# Patient Record
Sex: Female | Born: 1950
Health system: Southern US, Community
[De-identification: ages and names within clinical notes are randomized; demographics above are authoritative.]

## PROBLEM LIST (undated history)

## (undated) DIAGNOSIS — F32A Depression, unspecified: Secondary | ICD-10-CM

## (undated) DIAGNOSIS — K5909 Other constipation: Secondary | ICD-10-CM

## (undated) DIAGNOSIS — R7303 Prediabetes: Secondary | ICD-10-CM

## (undated) DIAGNOSIS — M51369 Other intervertebral disc degeneration, lumbar region without mention of lumbar back pain or lower extremity pain: Secondary | ICD-10-CM

## (undated) DIAGNOSIS — Z974 Presence of external hearing-aid: Secondary | ICD-10-CM

## (undated) DIAGNOSIS — Z720 Tobacco use: Secondary | ICD-10-CM

## (undated) DIAGNOSIS — K219 Gastro-esophageal reflux disease without esophagitis: Secondary | ICD-10-CM

## (undated) DIAGNOSIS — I10 Essential (primary) hypertension: Secondary | ICD-10-CM

## (undated) DIAGNOSIS — F329 Major depressive disorder, single episode, unspecified: Secondary | ICD-10-CM

## (undated) DIAGNOSIS — M1990 Unspecified osteoarthritis, unspecified site: Secondary | ICD-10-CM

## (undated) DIAGNOSIS — N950 Postmenopausal bleeding: Secondary | ICD-10-CM

## (undated) DIAGNOSIS — E039 Hypothyroidism, unspecified: Secondary | ICD-10-CM

## (undated) DIAGNOSIS — H3530 Unspecified macular degeneration: Secondary | ICD-10-CM

## (undated) DIAGNOSIS — D509 Iron deficiency anemia, unspecified: Secondary | ICD-10-CM

## (undated) DIAGNOSIS — R7301 Impaired fasting glucose: Secondary | ICD-10-CM

## (undated) DIAGNOSIS — E079 Disorder of thyroid, unspecified: Secondary | ICD-10-CM

## (undated) DIAGNOSIS — M722 Plantar fascial fibromatosis: Secondary | ICD-10-CM

## (undated) DIAGNOSIS — G4733 Obstructive sleep apnea (adult) (pediatric): Secondary | ICD-10-CM

## (undated) DIAGNOSIS — E669 Obesity, unspecified: Secondary | ICD-10-CM

## (undated) DIAGNOSIS — M5030 Other cervical disc degeneration, unspecified cervical region: Secondary | ICD-10-CM

## (undated) DIAGNOSIS — M5136 Other intervertebral disc degeneration, lumbar region: Secondary | ICD-10-CM

## (undated) DIAGNOSIS — Z973 Presence of spectacles and contact lenses: Secondary | ICD-10-CM

## (undated) DIAGNOSIS — M48061 Spinal stenosis, lumbar region without neurogenic claudication: Secondary | ICD-10-CM

## (undated) HISTORY — DX: Plantar fascial fibromatosis: M72.2

## (undated) HISTORY — PX: OTHER SURGICAL HISTORY: SHX169

## (undated) HISTORY — DX: Tobacco use: Z72.0

## (undated) HISTORY — PX: TONSILLECTOMY: SUR1361

## (undated) HISTORY — DX: Other intervertebral disc degeneration, lumbar region without mention of lumbar back pain or lower extremity pain: M51.369

## (undated) HISTORY — DX: Disorder of thyroid, unspecified: E07.9

## (undated) HISTORY — DX: Obesity, unspecified: E66.9

## (undated) HISTORY — PX: TUBAL LIGATION: SHX77

## (undated) HISTORY — DX: Impaired fasting glucose: R73.01

## (undated) HISTORY — DX: Depression, unspecified: F32.A

## (undated) HISTORY — DX: Major depressive disorder, single episode, unspecified: F32.9

## (undated) HISTORY — DX: Other intervertebral disc degeneration, lumbar region: M51.36

---

## 1992-12-12 HISTORY — PX: HEMORRHOID SURGERY: SHX153

## 1999-12-13 HISTORY — PX: UVULOPALATOPHARYNGOPLASTY: SHX827

## 2005-03-16 ENCOUNTER — Encounter: Payer: Self-pay | Admitting: Family Medicine

## 2005-09-27 ENCOUNTER — Ambulatory Visit: Payer: Self-pay | Admitting: Family Medicine

## 2005-11-28 ENCOUNTER — Ambulatory Visit: Payer: Self-pay | Admitting: Family Medicine

## 2005-12-26 ENCOUNTER — Ambulatory Visit: Payer: Self-pay | Admitting: Family Medicine

## 2006-01-04 ENCOUNTER — Ambulatory Visit: Payer: Self-pay | Admitting: Family Medicine

## 2006-01-09 ENCOUNTER — Ambulatory Visit: Payer: Self-pay | Admitting: Family Medicine

## 2006-01-26 ENCOUNTER — Ambulatory Visit: Payer: Self-pay | Admitting: Family Medicine

## 2006-02-23 ENCOUNTER — Ambulatory Visit: Payer: Self-pay | Admitting: Family Medicine

## 2006-05-29 ENCOUNTER — Ambulatory Visit: Payer: Self-pay | Admitting: Family Medicine

## 2006-09-19 DIAGNOSIS — M542 Cervicalgia: Secondary | ICD-10-CM | POA: Insufficient documentation

## 2006-09-19 DIAGNOSIS — E785 Hyperlipidemia, unspecified: Secondary | ICD-10-CM

## 2006-09-19 DIAGNOSIS — I10 Essential (primary) hypertension: Secondary | ICD-10-CM

## 2006-09-19 DIAGNOSIS — G4700 Insomnia, unspecified: Secondary | ICD-10-CM

## 2006-09-19 DIAGNOSIS — F329 Major depressive disorder, single episode, unspecified: Secondary | ICD-10-CM

## 2006-09-19 DIAGNOSIS — N951 Menopausal and female climacteric states: Secondary | ICD-10-CM | POA: Insufficient documentation

## 2006-09-19 DIAGNOSIS — F17200 Nicotine dependence, unspecified, uncomplicated: Secondary | ICD-10-CM

## 2006-09-19 DIAGNOSIS — L2089 Other atopic dermatitis: Secondary | ICD-10-CM

## 2006-10-02 ENCOUNTER — Encounter: Payer: Self-pay | Admitting: Family Medicine

## 2006-11-30 ENCOUNTER — Encounter: Payer: Self-pay | Admitting: Family Medicine

## 2006-12-01 ENCOUNTER — Encounter: Payer: Self-pay | Admitting: Family Medicine

## 2006-12-20 ENCOUNTER — Encounter: Payer: Self-pay | Admitting: Family Medicine

## 2007-12-25 ENCOUNTER — Ambulatory Visit: Payer: Self-pay | Admitting: Family Medicine

## 2007-12-26 ENCOUNTER — Encounter: Payer: Self-pay | Admitting: Family Medicine

## 2007-12-26 LAB — CONVERTED CEMR LAB
AST: 19 units/L (ref 0–37)
Albumin: 4.6 g/dL (ref 3.5–5.2)
Creatinine, Ser: 1.05 mg/dL (ref 0.40–1.20)
Glucose, Bld: 110 mg/dL — ABNORMAL HIGH (ref 70–99)
HCT: 47.6 % — ABNORMAL HIGH (ref 36.0–46.0)
Hemoglobin: 15.5 g/dL — ABNORMAL HIGH (ref 12.0–15.0)
MCV: 86.2 fL (ref 78.0–100.0)
Platelets: 303 10*3/uL (ref 150–400)
Potassium: 4.5 meq/L (ref 3.5–5.3)
Total Protein: 7.1 g/dL (ref 6.0–8.3)
WBC: 7.2 10*3/uL (ref 4.0–10.5)

## 2007-12-27 ENCOUNTER — Encounter: Payer: Self-pay | Admitting: Family Medicine

## 2007-12-27 ENCOUNTER — Telehealth: Payer: Self-pay | Admitting: Family Medicine

## 2008-01-02 ENCOUNTER — Encounter: Payer: Self-pay | Admitting: Family Medicine

## 2008-01-23 ENCOUNTER — Telehealth: Payer: Self-pay | Admitting: Family Medicine

## 2008-01-25 ENCOUNTER — Ambulatory Visit: Payer: Self-pay | Admitting: Family Medicine

## 2008-01-30 ENCOUNTER — Encounter: Payer: Self-pay | Admitting: Family Medicine

## 2008-01-30 ENCOUNTER — Encounter: Admission: RE | Admit: 2008-01-30 | Discharge: 2008-02-08 | Payer: Self-pay | Admitting: Family Medicine

## 2008-02-08 ENCOUNTER — Encounter: Payer: Self-pay | Admitting: Family Medicine

## 2008-02-14 ENCOUNTER — Telehealth: Payer: Self-pay | Admitting: Family Medicine

## 2008-02-19 ENCOUNTER — Encounter: Payer: Self-pay | Admitting: Family Medicine

## 2008-02-19 ENCOUNTER — Telehealth: Payer: Self-pay | Admitting: Family Medicine

## 2008-03-07 ENCOUNTER — Telehealth: Payer: Self-pay | Admitting: Family Medicine

## 2008-06-17 ENCOUNTER — Telehealth: Payer: Self-pay | Admitting: Family Medicine

## 2008-07-29 ENCOUNTER — Other Ambulatory Visit: Admission: RE | Admit: 2008-07-29 | Discharge: 2008-07-29 | Payer: Self-pay | Admitting: Family Medicine

## 2008-07-29 ENCOUNTER — Encounter: Payer: Self-pay | Admitting: Family Medicine

## 2008-07-29 ENCOUNTER — Ambulatory Visit: Payer: Self-pay | Admitting: Family Medicine

## 2008-07-30 ENCOUNTER — Encounter: Payer: Self-pay | Admitting: Family Medicine

## 2008-07-31 LAB — CONVERTED CEMR LAB
ALT: 15 units/L (ref 0–35)
AST: 17 units/L (ref 0–37)
Alkaline Phosphatase: 77 units/L (ref 39–117)
BUN: 15 mg/dL (ref 6–23)
Basophils Absolute: 0 10*3/uL (ref 0.0–0.1)
Basophils Relative: 0 % (ref 0–1)
Eosinophils Absolute: 0.2 10*3/uL (ref 0.0–0.7)
Eosinophils Relative: 3 % (ref 0–5)
Folate: >20 ng/mL
HCT: 48.9 % — ABNORMAL HIGH (ref 36.0–46.0)
HDL: 42 mg/dL (ref >39)
Hemoglobin: 16.3 g/dL — ABNORMAL HIGH (ref 12.0–15.0)
LDL Cholesterol: 119 mg/dL — ABNORMAL HIGH (ref 0–99)
Lymphocytes Relative: 29 % (ref 12–46)
Lymphs Abs: 2.1 10*3/uL (ref 0.7–4.0)
Monocytes Absolute: 0.8 10*3/uL (ref 0.1–1.0)
Neutro Abs: 4.2 10*3/uL (ref 1.7–7.7)
Neutrophils Relative %: 58 % (ref 43–77)
Platelets: 277 10*3/uL (ref 150–400)
Potassium: 4.1 meq/L (ref 3.5–5.3)
Total Bilirubin: 0.9 mg/dL (ref 0.3–1.2)
Total Protein: 7.5 g/dL (ref 6.0–8.3)
Vit D, 1,25-Dihydroxy: 24 — ABNORMAL LOW (ref 30–89)

## 2008-08-05 ENCOUNTER — Encounter: Admission: RE | Admit: 2008-08-05 | Discharge: 2008-08-05 | Payer: Self-pay | Admitting: Family Medicine

## 2008-09-01 ENCOUNTER — Ambulatory Visit: Payer: Self-pay | Admitting: Family Medicine

## 2008-09-01 DIAGNOSIS — R7301 Impaired fasting glucose: Secondary | ICD-10-CM

## 2008-09-01 LAB — CONVERTED CEMR LAB: Blood Glucose, Fasting: 113 mg/dL

## 2008-09-02 ENCOUNTER — Telehealth: Payer: Self-pay | Admitting: Family Medicine

## 2008-09-09 ENCOUNTER — Telehealth: Payer: Self-pay | Admitting: Family Medicine

## 2008-09-15 ENCOUNTER — Ambulatory Visit: Payer: Self-pay | Admitting: Family Medicine

## 2008-09-15 ENCOUNTER — Encounter: Admission: RE | Admit: 2008-09-15 | Discharge: 2008-09-15 | Payer: Self-pay | Admitting: Family Medicine

## 2008-09-15 DIAGNOSIS — M5137 Other intervertebral disc degeneration, lumbosacral region: Secondary | ICD-10-CM

## 2008-09-18 ENCOUNTER — Encounter: Payer: Self-pay | Admitting: Family Medicine

## 2008-09-26 ENCOUNTER — Ambulatory Visit: Payer: Self-pay | Admitting: Family Medicine

## 2008-09-26 DIAGNOSIS — M79609 Pain in unspecified limb: Secondary | ICD-10-CM | POA: Insufficient documentation

## 2008-09-30 ENCOUNTER — Encounter: Payer: Self-pay | Admitting: Family Medicine

## 2008-10-06 ENCOUNTER — Telehealth: Payer: Self-pay | Admitting: Family Medicine

## 2008-10-09 ENCOUNTER — Telehealth: Payer: Self-pay | Admitting: Family Medicine

## 2008-10-27 ENCOUNTER — Ambulatory Visit: Payer: Self-pay | Admitting: Family Medicine

## 2008-10-27 ENCOUNTER — Encounter: Admission: RE | Admit: 2008-10-27 | Discharge: 2008-10-27 | Payer: Self-pay | Admitting: Family Medicine

## 2008-10-27 DIAGNOSIS — R1319 Other dysphagia: Secondary | ICD-10-CM

## 2008-10-27 DIAGNOSIS — Q762 Congenital spondylolisthesis: Secondary | ICD-10-CM | POA: Insufficient documentation

## 2008-10-31 ENCOUNTER — Encounter: Admission: RE | Admit: 2008-10-31 | Discharge: 2008-12-11 | Payer: Self-pay | Admitting: Sports Medicine

## 2008-11-10 ENCOUNTER — Encounter: Payer: Self-pay | Admitting: Family Medicine

## 2008-11-19 ENCOUNTER — Telehealth (INDEPENDENT_AMBULATORY_CARE_PROVIDER_SITE_OTHER): Payer: Self-pay | Admitting: *Deleted

## 2008-11-20 ENCOUNTER — Ambulatory Visit: Payer: Self-pay | Admitting: Family Medicine

## 2008-11-20 LAB — CONVERTED CEMR LAB: Blood Glucose, Fasting: 119 mg/dL

## 2008-11-26 ENCOUNTER — Encounter: Payer: Self-pay | Admitting: Family Medicine

## 2008-12-08 IMAGING — CR DG LUMBAR SPINE 2-3V
3 series · 3 of 3 positions shown · non-contrast
Comparison: None

CLINICAL DATA: Chronic low back pain.

LUMBAR SPINE - 2-3 VIEW

[view not recorded (1 of 3)]
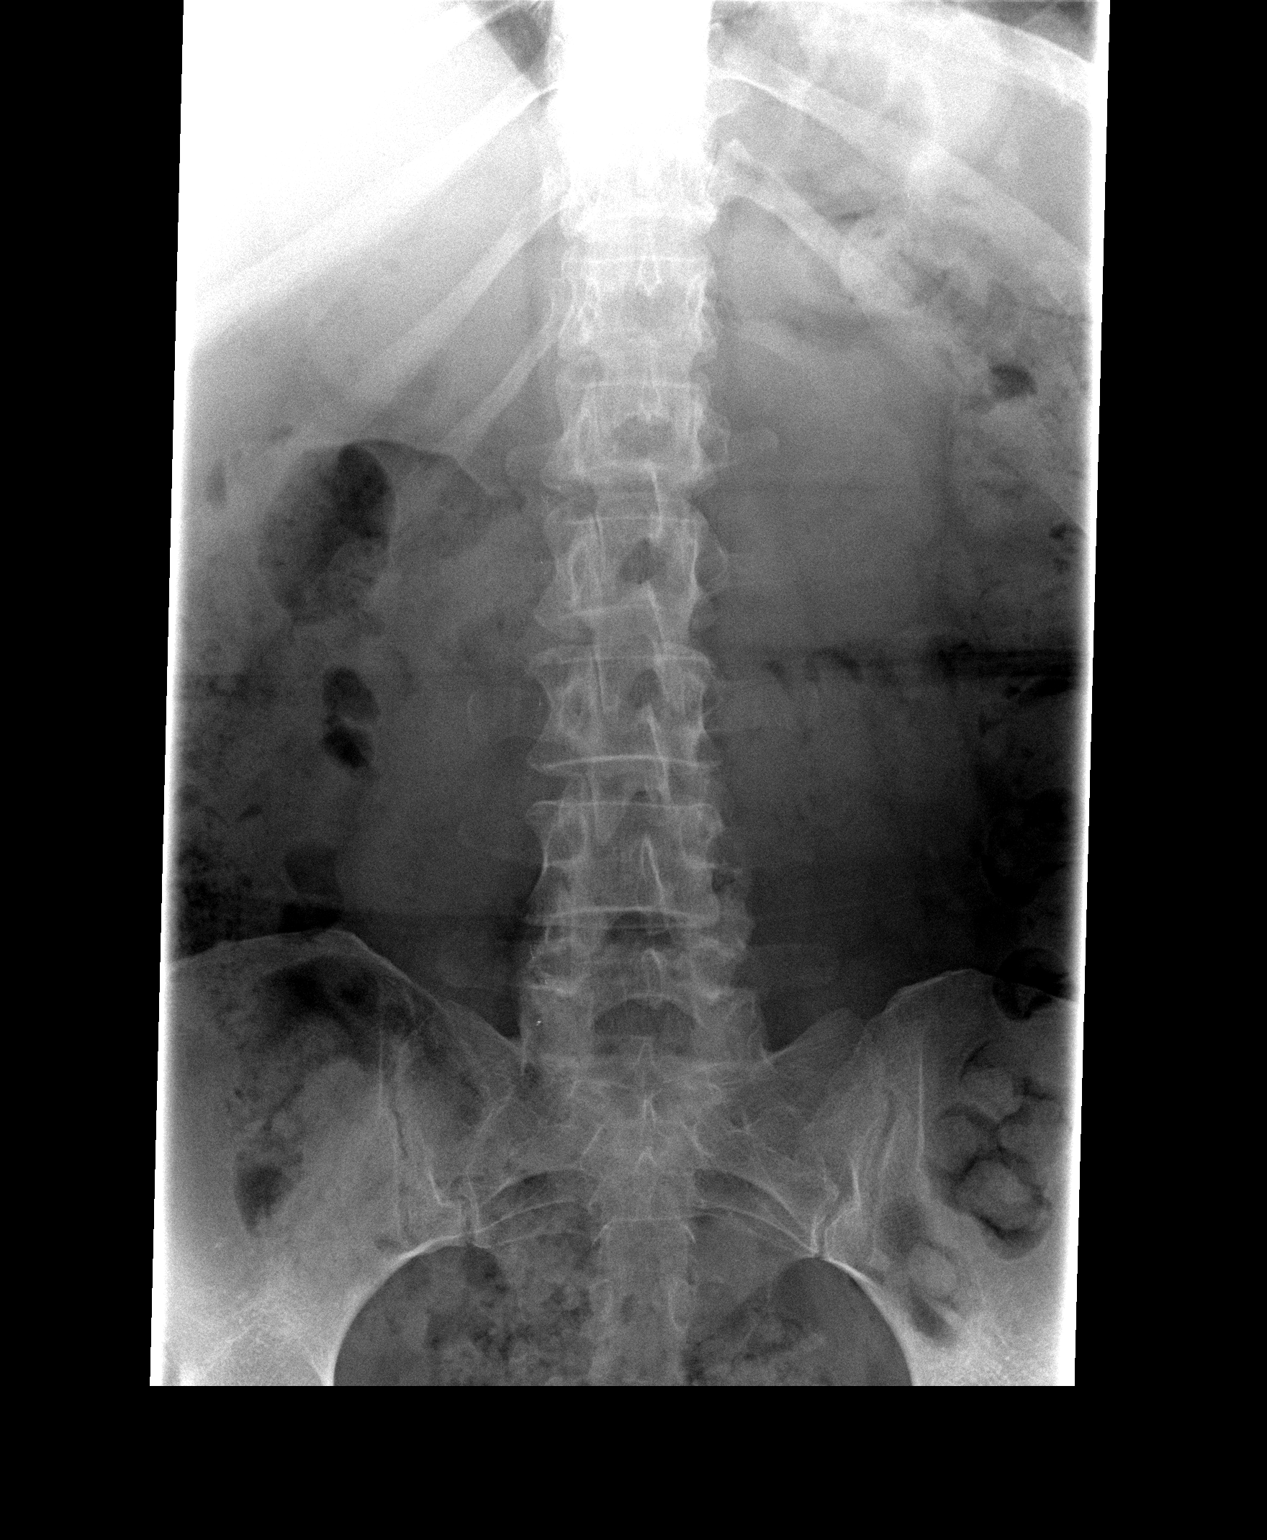

[view not recorded (2 of 3)]
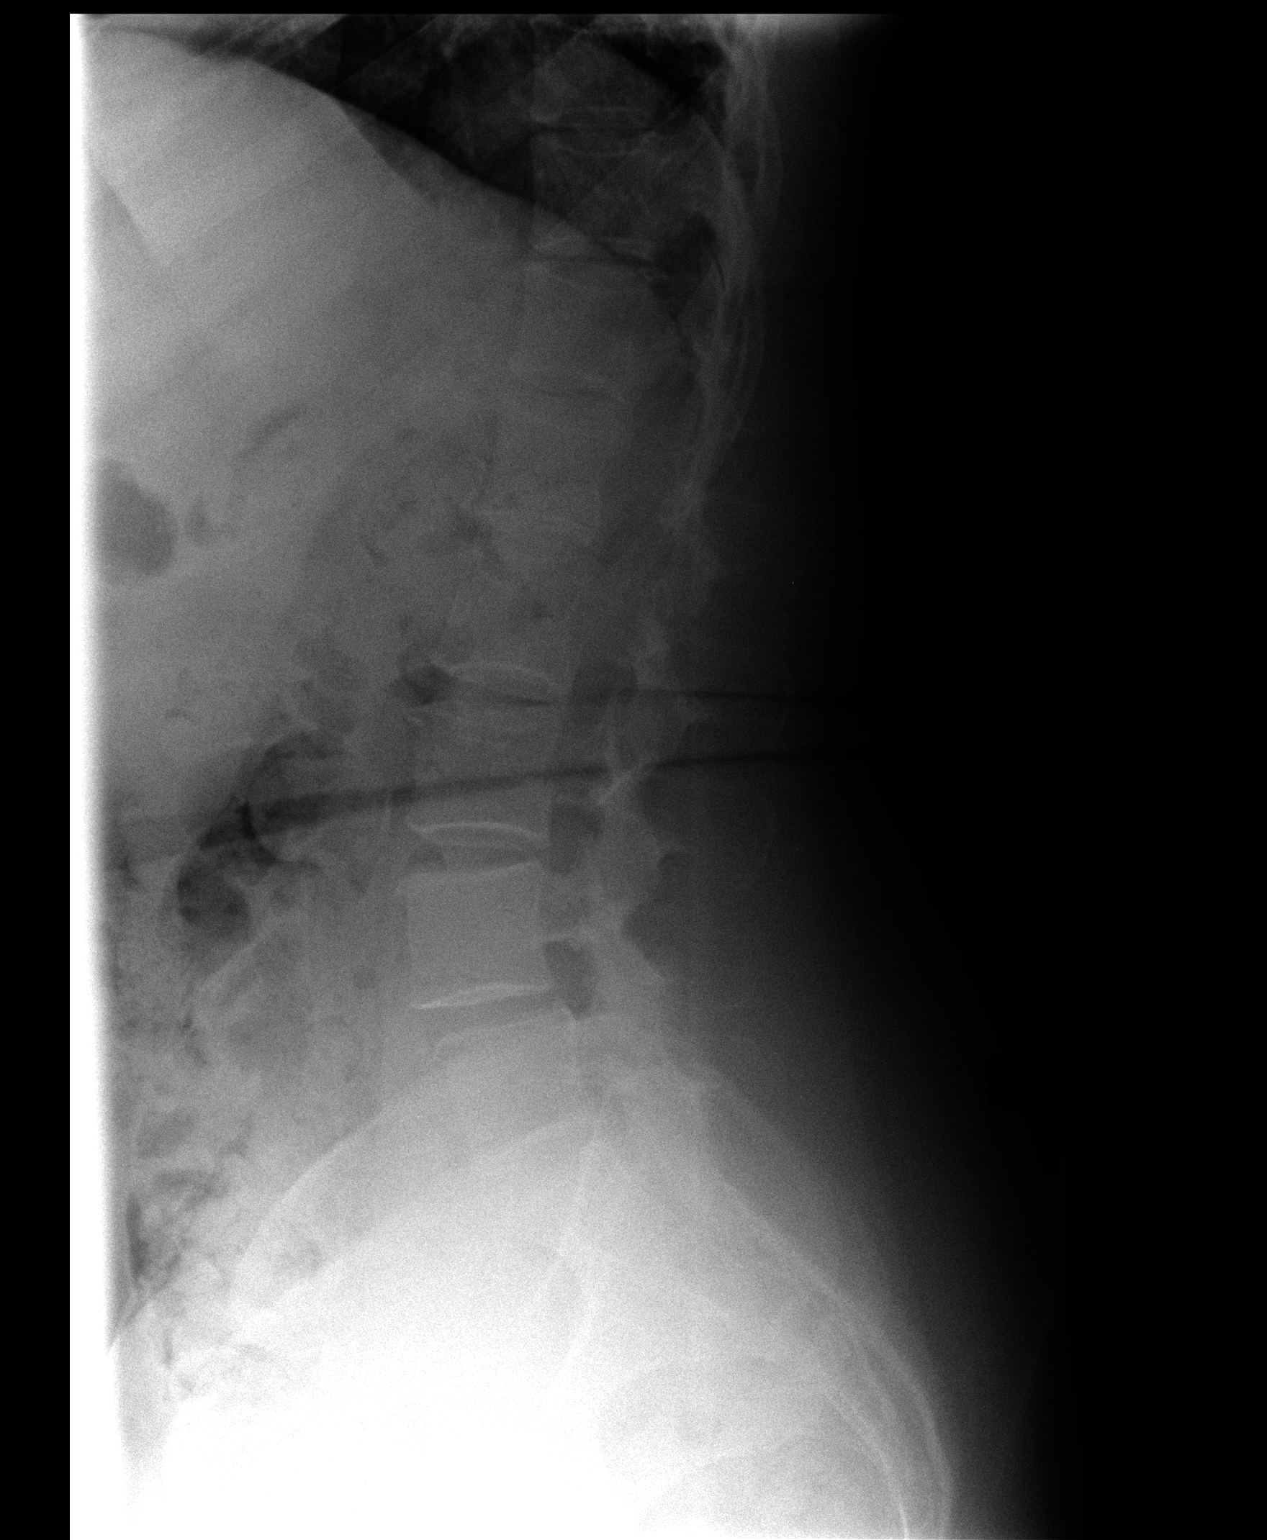

[view not recorded (3 of 3)]
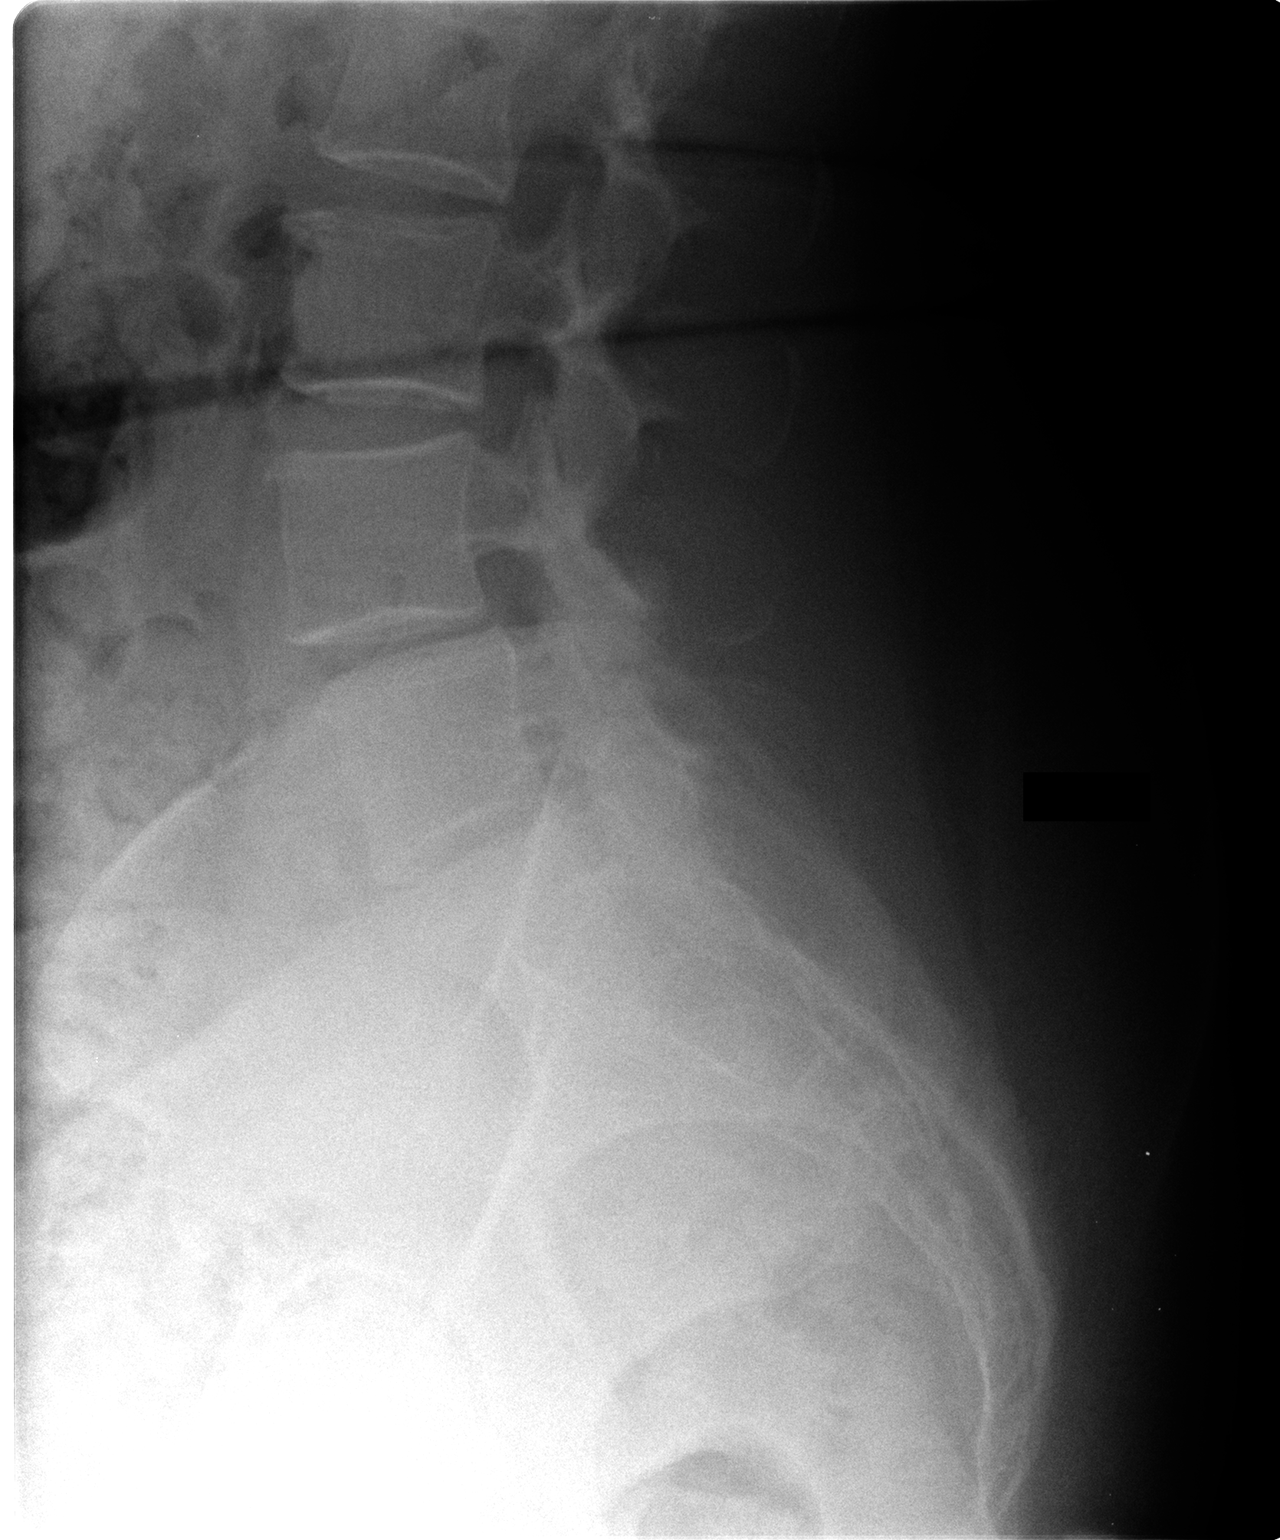

[3 of 3 positions shown; findings below may reference images not displayed]

FINDINGS: There is mild grade 1 slip of L4 on L5 which appears to
be due to facet degeneration.  There is mild disc space narrowing
at L4-5.  The remaining disc spaces are intact.  There is no
fracture.
IMPRESSION: Grade 1 slip of L4-L5.  No acute bony abnormality.

## 2008-12-09 ENCOUNTER — Telehealth: Payer: Self-pay | Admitting: Family Medicine

## 2009-01-02 ENCOUNTER — Telehealth: Payer: Self-pay | Admitting: Family Medicine

## 2009-04-03 ENCOUNTER — Telehealth: Payer: Self-pay | Admitting: Family Medicine

## 2009-06-05 ENCOUNTER — Telehealth: Payer: Self-pay | Admitting: Family Medicine

## 2009-06-16 ENCOUNTER — Telehealth: Payer: Self-pay | Admitting: Family Medicine

## 2009-06-19 ENCOUNTER — Ambulatory Visit: Payer: Self-pay | Admitting: Family Medicine

## 2009-07-17 ENCOUNTER — Telehealth: Payer: Self-pay | Admitting: Family Medicine

## 2009-08-19 ENCOUNTER — Telehealth: Payer: Self-pay | Admitting: Family Medicine

## 2009-09-15 ENCOUNTER — Telehealth: Payer: Self-pay | Admitting: Family Medicine

## 2009-12-02 ENCOUNTER — Telehealth: Payer: Self-pay | Admitting: Family Medicine

## 2009-12-16 ENCOUNTER — Telehealth: Payer: Self-pay | Admitting: Family Medicine

## 2010-01-07 ENCOUNTER — Telehealth: Payer: Self-pay | Admitting: Family Medicine

## 2010-02-17 ENCOUNTER — Telehealth (INDEPENDENT_AMBULATORY_CARE_PROVIDER_SITE_OTHER): Payer: Self-pay | Admitting: *Deleted

## 2010-03-22 ENCOUNTER — Telehealth: Payer: Self-pay | Admitting: Family Medicine

## 2010-06-25 ENCOUNTER — Ambulatory Visit: Payer: Self-pay | Admitting: Family Medicine

## 2010-06-25 DIAGNOSIS — R5381 Other malaise: Secondary | ICD-10-CM | POA: Insufficient documentation

## 2010-06-25 DIAGNOSIS — R1310 Dysphagia, unspecified: Secondary | ICD-10-CM | POA: Insufficient documentation

## 2010-06-25 DIAGNOSIS — R5383 Other fatigue: Secondary | ICD-10-CM

## 2010-06-26 ENCOUNTER — Encounter: Payer: Self-pay | Admitting: Family Medicine

## 2010-06-28 LAB — CONVERTED CEMR LAB
Anti Nuclear Antibody(ANA): POSITIVE — AB
BUN: 16 mg/dL (ref 6–23)
CRP: 0.7 mg/dL — ABNORMAL HIGH (ref <0.6)
Chloride: 100 meq/L (ref 96–112)
Glucose, Bld: 96 mg/dL (ref 70–99)
HCT: 45.4 % (ref 36.0–46.0)
MCV: 87.6 fL (ref 78.0–100.0)
Platelets: 298 10*3/uL (ref 150–400)
Potassium: 3.9 meq/L (ref 3.5–5.3)

## 2010-07-09 ENCOUNTER — Telehealth: Payer: Self-pay | Admitting: Family Medicine

## 2010-07-14 ENCOUNTER — Telehealth: Payer: Self-pay | Admitting: Family Medicine

## 2010-08-10 ENCOUNTER — Ambulatory Visit: Payer: Self-pay | Admitting: Family Medicine

## 2010-08-10 ENCOUNTER — Encounter: Admission: RE | Admit: 2010-08-10 | Discharge: 2010-08-10 | Payer: Self-pay | Admitting: Family Medicine

## 2010-08-13 ENCOUNTER — Telehealth: Payer: Self-pay | Admitting: Family Medicine

## 2010-08-31 ENCOUNTER — Ambulatory Visit: Payer: Self-pay | Admitting: Obstetrics & Gynecology

## 2010-09-02 ENCOUNTER — Encounter: Admission: RE | Admit: 2010-09-02 | Discharge: 2010-09-02 | Payer: Self-pay | Admitting: Obstetrics & Gynecology

## 2010-09-17 ENCOUNTER — Ambulatory Visit: Payer: Self-pay | Admitting: Family Medicine

## 2010-09-23 ENCOUNTER — Telehealth: Payer: Self-pay | Admitting: Family Medicine

## 2010-10-04 ENCOUNTER — Ambulatory Visit (HOSPITAL_COMMUNITY): Payer: Self-pay | Admitting: Psychology

## 2010-10-05 ENCOUNTER — Telehealth (INDEPENDENT_AMBULATORY_CARE_PROVIDER_SITE_OTHER): Payer: Self-pay | Admitting: *Deleted

## 2010-10-11 ENCOUNTER — Ambulatory Visit (HOSPITAL_COMMUNITY): Payer: Self-pay | Admitting: Psychology

## 2011-01-11 NOTE — Assessment & Plan Note (Signed)
Summary: dysphagia/ depression   Vital Signs:  Patient profile:   60 year old female Height:      63 inches Weight:      167 pounds BMI:     29.69 O2 Sat:      99 % on Room air Temp:     98.9 degrees F oral Pulse rate:   91 / minute BP sitting:   138 / 81  (left arm) Cuff size:   regular  Vitals Entered By: Payton Spark CMA (June 25, 2010 2:32 PM)  O2 Flow:  Room air CC: ST x 6 weeks. Describes as raw and tight.   Primary Care Provider:  Seymour Bars DO  CC:  ST x 6 weeks. Describes as raw and tight..  History of Present Illness: 60 yo WF presents for sore throat x 6 wks.  She feels tightness in her throat.  It gets worse at the end of the day.  She also feels like the skin on her arms and legs are too tight.  She is also having pain in her toes.  She quit smoking 3 mos ago.  She has vague aches and pains all over which seem to come and go and then admits that she has been under a great deal of stress and is quite tearful.  Her son got out of jail last year and she has not spoken to him.  Her husband is not a great listener and she has isolated herself from other family or any friends.      Current Medications (verified): 1)  Benazepril-Hydrochlorothiazide 20-12.5 Mg  Tabs (Benazepril-Hydrochlorothiazide) .Marland Kitchen.. 1 Tab By Mouth Two Times A Day 2)  Simvastatin 20 Mg  Tabs (Simvastatin) .Marland Kitchen.. 1 Tab By Mouth Qhs 3)  Omeprazole 20 Mg Cpdr (Omeprazole) .... By Mouth Once Daily 4)  Zolpidem Tartrate 10 Mg Tabs (Zolpidem Tartrate) .Marland Kitchen.. 1 Tab By Mouth At Bedtime As Needed Sleep 5)  Cyclobenzaprine Hcl 5 Mg  Tabs (Cyclobenzaprine Hcl) .Marland Kitchen.. 1-2 Tabs By Mouth At Bedtime As Needed Back Pain 6)  Vitamin D [address] Unit  Caps (Ergocalciferol) .... One By Mouth Once A Week.  Allergies (verified): 1)  ! Codeine 2)  ! Penicillin  Past History:  Past Medical History: plantar fasciits (seeing Dr Ihor Gully) Mild Lumbar DDD tobacco abuse impaired fasting glucose depression  Past Surgical  History: Reviewed history from 07/29/2008 and no changes required. LESI under fluroscopy w/ Dr Yves Dill tonsillectomy, adenoids, and uvula removed for sleep apnea c/sec  tubal ligation  Social History: Reviewed history from 07/29/2008 and no changes required. 2nd marriage to husband Molly Maduro.  Retired from Engineer, production for [employer]. 25 yo son in Prison in [place]-- gets out in 2007.    Walks daily. Current Smoker.  Husband is also a smoker.   Review of Systems Psych:  Complains of anxiety, depression, easily angered, easily tearful, and irritability; denies alternate hallucination ( auditory/visual), panic attacks, sense of great danger, suicidal thoughts/plans, and thoughts of violence.  Physical Exam  General:  alert, well-hydrated, and overweight-appearing.   Head:  normocephalic and atraumatic.   Eyes:  conjunctiva clar Nose:  no nasal discharge.   Mouth:  pharynx pink and moist and fair dentition.   Neck:  supple and no masses.  able to swallow on command Lungs:  Normal respiratory effort, chest expands symmetrically. Lungs are clear to auscultation, no crackles or wheezes. Heart:  Normal rate and regular rhythm. S1 and S2 normal without gallop, murmur, click, rub  or other extra sounds. Abdomen:  soft and non-tender.   Msk:  no joint tenderness, no joint swelling, and no joint warmth.   Pulses:  2+ radial pulses Extremities:  no LE dema Skin:  color normal and no suspicious lesions.   Cervical Nodes:  No lymphadenopathy noted Psych:  depressed affect.     Impression & Recommendations:  Problem # 1:  DEPRESSION, MAJOR, RECURRENT (ICD-296.30) Rebecca Austin seems very depressed, worsened by mutliple acute life stressors.  Failed to improve on SSRIs in the past.  Declined counseling referrral.  Verbally contracts that she will not harm herself or others.  Agrees to try Wellbutrin.  Call if any worsening in mood with new start of meds.  RTC in 1 month for f/u.     Problem # 2:  FATIGUE (ICD-780.79) Likely to be secondary to depression but will update her labs and include a CBC. Orders: T-CBC No Diff ([id number])  Problem # 3:  DYSPHAGIA UNSPECIFIED (ICD-787.20) Assessment: New 'Tightness' in throat w/o heartburn or food getting stuck in the esphagus, also with 'tightness' of skin concerning for scleroderma.  Normal exam findings.  Will include labs for screening for scleroderma today.  If all is normal, this may be anxiety induced.  Problem # 4:  HYPERTENSION, BENIGN SYSTEMIC (ICD-401.1)  Her updated medication list for this problem includes:    Benazepril-hydrochlorothiazide 20-12.5 Mg Tabs (Benazepril-hydrochlorothiazide) .Marland Kitchen... 1 tab by mouth two times a day  Orders: T-Basic Metabolic Panel ([id number])  BP today: 138/81 Prior BP: 111/68 (06/19/2009)  Labs Reviewed: K+: 4.1 (07/30/2008) Creat: : 0.96 (07/30/2008)   Chol: 198 (07/30/2008)   HDL: 42 (07/30/2008)   LDL: 119 (07/30/2008)   TG: 186 (07/30/2008)  Complete Medication List: 1)  Benazepril-hydrochlorothiazide 20-12.5 Mg Tabs (Benazepril-hydrochlorothiazide) .Marland Kitchen.. 1 tab by mouth two times a day 2)  Simvastatin 20 Mg Tabs (Simvastatin) .Marland Kitchen.. 1 tab by mouth qhs 3)  Omeprazole 20 Mg Cpdr (Omeprazole) .... By mouth once daily 4)  Zolpidem Tartrate 10 Mg Tabs (Zolpidem tartrate) .Marland Kitchen.. 1 tab by mouth at bedtime as needed sleep 5)  Cyclobenzaprine Hcl 5 Mg Tabs (Cyclobenzaprine hcl) .Marland Kitchen.. 1-2 tabs by mouth at bedtime as needed back pain 6)  Budeprion Xl 150 Mg Xr24h-tab (Bupropion hcl) .Marland Kitchen.. 1 tab by mouth qam 7)  Alprazolam 0.5 Mg Tabs (Alprazolam) .Marland Kitchen.. 1 tab by mouth two times a day as needed anxiety  Other Orders: T-ANA ([id number]) T-TSH [phone]) T-CRP (C-Reactive Protein) ([address]) T-Sed Rate (Automated) ([id number])  Patient Instructions: 1)  For mood, take generic wellbutrin every morning. 2)  Use Alprazolam as needed for anxiety. 3)  Labs today. 4)  Will call you w/  results on Monday. 5)  Return for f/u mood in 6 wks. Prescriptions: ALPRAZOLAM 0.5 MG TABS (ALPRAZOLAM) 1 tab by mouth two times a day as needed anxiety  #45 x 0   Entered and Authorized by:   Seymour Bars DO   Signed by:   Seymour Bars DO on 06/25/2010   Method used:   Printed then faxed to ...       [facility] [address] (retail)       [address]       [place], [place]  [address]       Ph: [phone]       Fax: [phone]   RxID:   [id number] BUDEPRION XL 150 MG XR24H-TAB (BUPROPION HCL) 1 tab by mouth qAM  #30 x 1   Entered and Authorized by:   Seymour Bars  DO   Signed by:   Seymour Bars DO on 06/25/2010   Method used:   Electronically to        [facility] ([facility]ail)       [address]       [place], [place]  [address]       Ph: [phone]       Fax: [phone]   RxID:   [id number]

## 2011-01-11 NOTE — Progress Notes (Signed)
Summary: Chantix continue pack       New/Updated Medications: CHANTIX CONTINUING MONTH PAK 1 MG TABS (VARENICLINE TARTRATE) Take as directed Prescriptions: CHANTIX CONTINUING MONTH PAK 1 MG TABS (VARENICLINE TARTRATE) Take as directed  #1 box x 1   Entered by:   Payton Spark CMA   Authorized by:   Seymour Bars DO   Signed by:   Payton Spark CMA on 02/17/2010   Method used:   Electronically to        [facility] ([facility]ail[facility]       [address]       [place], [place]  [address]       Ph: [phone]       Fax: [phone]   RxID:   [id number]

## 2011-01-11 NOTE — Letter (Signed)
Summary: Depression Questionnaire  Depression Questionnaire   Imported By: Lanelle Bal 09/29/2010 12:20:40  _____________________________________________________________________  External Attachment:    Type:   Image     Comment:   External Document

## 2011-01-11 NOTE — Progress Notes (Signed)
Summary: Xanax refill  Phone Note Call from Patient   Caller: Patient Summary of Call: Pt LMOM stating she has been taking alprazolam 2 tabs two times a day bc her anxiety has been so bad. Pt states she has taken her last 2 pills today and needs refilled. Please advise. Initial call taken by: Payton Spark CMA,  July 14, 2010 11:15 AM  Follow-up for Phone Call        changed dose and sent new RX.  Will not increase from here. Follow-up by: Seymour Bars DO,  July 14, 2010 11:35 AM    New/Updated Medications: ALPRAZOLAM 1 MG TABS (ALPRAZOLAM) 1 tab by mouth two times a day as needed anxiety Prescriptions: ALPRAZOLAM 1 MG TABS (ALPRAZOLAM) 1 tab by mouth two times a day as needed anxiety  #60 x 0   Entered and Authorized by:   Seymour Bars DO   Signed by:   Seymour Bars DO on 07/14/2010   Method used:   Printed then faxed to ...       [facility] [address] (retail)       [address]       [place], [place]  [address]       Ph: [phone]       Fax: [phone]   RxID:   [id number]   Appended Document: Xanax refill Pt aware

## 2011-01-11 NOTE — Progress Notes (Signed)
Summary: Alprazolam refill  Phone Note Refill Request   Refills Requested: Medication #1:  ALPRAZOLAM 1 MG TABS 1 tab by mouth two times a day as needed anxiety Initial call taken by: Payton Spark CMA,  August 13, 2010 12:31 PM    Prescriptions: ALPRAZOLAM 1 MG TABS (ALPRAZOLAM) 1 tab by mouth two times a day as needed anxiety  #60 x 0   Entered and Authorized by:   Seymour Bars DO   Signed by:   Seymour Bars DO on 08/13/2010   Method used:   Printed then faxed to ...       [facility] [address] (retail)       [address]       [place], [place]  [address]       Ph: [phone]       Fax: [phone]   RxID:   [id number]

## 2011-01-11 NOTE — Progress Notes (Signed)
Summary: Zolpidem refills  Phone Note Refill Request   Refills Requested: Medication #1:  ZOLPIDEM TARTRATE 10 MG TABS 1 tab by mouth at bedtime as needed sleep Initial call taken by: Payton Spark CMA,  March 22, 2010 8:33 AM    Prescriptions: ZOLPIDEM TARTRATE 10 MG TABS (ZOLPIDEM TARTRATE) 1 tab by mouth at bedtime as needed sleep  #90 x 0   Entered and Authorized by:   Seymour Bars DO   Signed by:   Seymour Bars DO on 03/22/2010   Method used:   Printed then faxed to ...       [facility] [address] (retail)       [address]       [place], [place]  [address]       Ph: [phone]       Fax: [phone]   RxID:   [id number]

## 2011-01-11 NOTE — Progress Notes (Signed)
Summary: Smoking cessation  Phone Note Call from Patient   Caller: Patient Summary of Call: Pt and spouse would like to start smoking cessation. They would like Chantix called to pharm. Please advise . Initial call taken by: Payton Spark CMA,  January 07, 2010 3:44 PM    New/Updated Medications: CHANTIX STARTING MONTH PAK 0.5 MG X 11 & 1 MG X 42 TABS (VARENICLINE TARTRATE) use as directed Prescriptions: CHANTIX STARTING MONTH PAK 0.5 MG X 11 & 1 MG X 42 TABS (VARENICLINE TARTRATE) use as directed  #1 pack x 0   Entered and Authorized by:   Seymour Bars DO   Signed by:   Seymour Bars DO on 01/07/2010   Method used:   Electronically to        [facility] ([facility]ail)       [address]       [place], [place]  [address]       Ph: [phone]       Fax: [phone]   RxID:   [id number]

## 2011-01-11 NOTE — Assessment & Plan Note (Signed)
Summary: depression   Vital Signs:  Patient profile:   60 year old female Height:      63 inches Weight:      172 pounds BMI:     30.58 O2 Sat:      97 % on Room air Pulse rate:   99 / minute BP sitting:   124 / 76  (left arm) Cuff size:   regular  Vitals Entered By: Payton Spark CMA (August 10, 2010 2:01 PM)  O2 Flow:  Room air CC: F/U mood.   Primary Care Provider:  Seymour Bars DO  CC:  F/U mood.Marland Kitchen  History of Present Illness: 60 yo WF presents for f/u depression.  She has a lot of stress related to her son who is a drug/ ETOH abuser with 4 kids who all have problems.  She was started on generic Wellbutrin last month and has not noticed much of a difference.  Declined cousneling last visit.  Has a good support system.  REalizes that she has a long fam hx of depression.    She is drinking 1 glass of wine / day.    Current Medications (verified): 1)  Benazepril-Hydrochlorothiazide 20-12.5 Mg  Tabs (Benazepril-Hydrochlorothiazide) .Marland Kitchen.. 1 Tab By Mouth Two Times A Day 2)  Simvastatin 20 Mg  Tabs (Simvastatin) .Marland Kitchen.. 1 Tab By Mouth Qhs 3)  Omeprazole 20 Mg Cpdr (Omeprazole) .... By Mouth Once Daily 4)  Zolpidem Tartrate 10 Mg Tabs (Zolpidem Tartrate) .Marland Kitchen.. 1 Tab By Mouth At Bedtime As Needed Sleep 5)  Cyclobenzaprine Hcl 5 Mg  Tabs (Cyclobenzaprine Hcl) .Marland Kitchen.. 1-2 Tabs By Mouth At Bedtime As Needed Back Pain 6)  Budeprion Xl 150 Mg Xr24h-Tab (Bupropion Hcl) .Marland Kitchen.. 1 Tab By Mouth Qam 7)  Alprazolam 1 Mg Tabs (Alprazolam) .Marland Kitchen.. 1 Tab By Mouth Two Times A Day As Needed Anxiety 8)  Tramadol Hcl 50 Mg Tabs (Tramadol Hcl) .Marland Kitchen.. 1 Tab By Mouth Two Times A Day As Needed Back Pain  Allergies (verified): 1)  ! Codeine 2)  ! Penicillin  Past History:  Past Medical History: Reviewed history from 06/25/2010 and no changes required. plantar fasciits (seeing Dr Ihor Gully) Mild Lumbar DDD tobacco abuse impaired fasting glucose depression  Past Surgical History: Reviewed history from  07/29/2008 and no changes required. LESI under fluroscopy w/ Dr Yves Dill tonsillectomy, adenoids, and uvula removed for sleep apnea c/sec  tubal ligation  Family History: Reviewed history from 09/15/2008 and no changes required. 1 brother- healthy,  3 sisters- 1 w/ osteoporosis,  father HTN, depression, died AAA mother died of postmenopausal breast ca, age 25 MGF - diabetes  Social History: Reviewed history from 07/29/2008 and no changes required. 2nd marriage to husband Molly Maduro.  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    Walks daily. Current Smoker.  Husband is also a smoker.   Review of Systems Psych:  Complains of anxiety, depression, easily angered, easily tearful, irritability, and suicidal thoughts/plans; denies panic attacks, thoughts of violence, unusual visions or sounds, and thoughts /plans of harming others; no action plan to hurt herself. 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  Physical Exam  General:  alert, well-developed, well-nourished, and well-hydrated.   Psych:  depressed affect, tearful, and slightly anxious.     Impression & Recommendations:  Problem # 1:  DEPRESSION, MAJOR, RECURRENT (ICD-296.30) PHQ 9 still 58 today after 1 month of Wellubtrin 150 mg/ day.  Will increase her to 300  mg/ day. Call me in 2 wks to let me know how mood is.  May add Cymbalta on board in 2 wks. Use Alprazolam as needed for anxiety.  Limit ETOH. Verbally contracts that she will not harm herself.  RTC in 6 wks.  Complete Medication List: 1)  Benazepril-hydrochlorothiazide 20-12.5 Mg Tabs (Benazepril-hydrochlorothiazide) .Marland Kitchen.. 1 tab by mouth two times a day 2)  Simvastatin 20 Mg Tabs (Simvastatin) .Marland Kitchen.. 1 tab by mouth qhs 3)  Omeprazole 20 Mg Cpdr (Omeprazole) .... By mouth once daily 4)  Zolpidem Tartrate 10 Mg Tabs (Zolpidem tartrate) .Marland Kitchen.. 1 tab by mouth at bedtime as needed sleep 5)   Cyclobenzaprine Hcl 5 Mg Tabs (Cyclobenzaprine hcl) .Marland Kitchen.. 1-2 tabs by mouth at bedtime as needed back pain 6)  Budeprion Xl 300 Mg Xr24h-tab (Bupropion hcl) .Marland Kitchen.. 1 tab by mouth daily 7)  Alprazolam 1 Mg Tabs (Alprazolam) .Marland Kitchen.. 1 tab by mouth two times a day as needed anxiety 8)  Tramadol Hcl 50 Mg Tabs (Tramadol hcl) .Marland Kitchen.. 1 tab by mouth two times a day as needed back pain  Other Orders: Gynecologic Referral (Gyn)  Patient Instructions: 1)  Go up on Wellbutrin to 300 mg once daily for mood. 2)  Use Alprazolam as needed.  Will RF on FRI. 3)  Call me in 2 wks and let me know how your mood is doing. 4)  May add Cymbalta on board. 5)  Call to set up counseling downstairs when you are ready. 6)  Return for f/u in 6 wks. Prescriptions: BUDEPRION XL 300 MG XR24H-TAB (BUPROPION HCL) 1 tab by mouth daily  #30 x 2   Entered and Authorized by:   Seymour Bars DO   Signed by:   Seymour Bars DO on 08/10/2010   Method used:   Electronically to        [facility] ([facility]ail)       [address]       [place], [place]  [address]       Ph: [phone]       Fax: [phone]   RxID:   [id number]

## 2011-01-11 NOTE — Letter (Signed)
Summary: Depression Questionnaire  Depression Questionnaire   Imported By: Lanelle Bal 08/18/2010 08:27:05  _____________________________________________________________________  External Attachment:    Type:   Image     Comment:   External Document

## 2011-01-11 NOTE — Progress Notes (Signed)
Summary: Increase seroquel  Phone Note Call from Patient   Caller: Patient Summary of Call: Pt LMOM stating that after speaking w/ sleep clinic she has decided that her problem is that she can not fall asleep. She was told that the CPAP is not going to help w/ this. She states that seroquel is not helping like she had hoped and wanted to know if she could increase the dose. Please advise.  Initial call taken by: Payton Spark CMA,  October 05, 2010 3:47 PM  Follow-up for Phone Call        Yes we can try an inc dose. Then f/u with Dr. B in 2 weeks.  Follow-up by: Nani Gasser MD,  October 05, 2010 3:47 PM  Additional Follow-up for Phone Call Additional follow up Details #1::        Pt aware of the above Additional Follow-up by: Payton Spark CMA,  October 05, 2010 3:51 PM    New/Updated Medications: SEROQUEL XR 150 MG XR24H-TAB (QUETIAPINE FUMARATE) Take 1 tablet by mouth once a day Prescriptions: SEROQUEL XR 150 MG XR24H-TAB (QUETIAPINE FUMARATE) Take 1 tablet by mouth once a day  #30 x 1   Entered and Authorized by:   Nani Gasser MD   Signed by:   Nani Gasser MD on 10/05/2010   Method used:   Electronically to        [facility] ([facility]ail)       [address]       [place], [place]  [address]       Ph: [phone]       Fax: [phone]   RxID:   [id number]

## 2011-01-11 NOTE — Progress Notes (Signed)
Summary: Zolpidem refill  Phone Note Refill Request Message from:  Patient on December 16, 2009 1:33 PM  Refills Requested: Medication #1:  ZOLPIDEM TARTRATE 10 MG TABS 1 tab by mouth at bedtime as needed sleep Initial call taken by: Payton Spark CMA,  December 16, 2009 1:33 PM    Prescriptions: ZOLPIDEM TARTRATE 10 MG TABS (ZOLPIDEM TARTRATE) 1 tab by mouth at bedtime as needed sleep  #90 x 0   Entered and Authorized by:   Seymour Bars DO   Signed by:   Seymour Bars DO on 12/16/2009   Method used:   Printed then faxed to ...       [facility] [address] (retail)       [address]       [place], [place]  [address]       Ph: [phone]       Fax: [phone]   RxID:   [id number]

## 2011-01-11 NOTE — Assessment & Plan Note (Signed)
Summary: f/u mood   Vital Signs:  Patient profile:   60 year old female Height:      63 inches Weight:      169 pounds BMI:     30.05 O2 Sat:      96 % on Room air Pulse rate:   92 / minute BP sitting:   125 / 78  (left arm) Cuff size:   regular  Vitals Entered By: Payton Spark CMA (September 17, 2010 1:38 PM)  O2 Flow:  Room air CC: F/U mood.   Primary Care Provider:  Seymour Bars DO  CC:  F/U mood.Marland Kitchen  History of Present Illness: 60 yo WF presents for f/u mood.  6 wks ago, she had a PHQ -9 score of 17.  She was started on a low dose of HRT with Dr Marice Potter last month which has helped some.  She had some Venlafaxine last year and it helped but she came off of it in the winter.  She was increased on her Wellbutrin to 300 mg last visit.  She admits to doubling the dose of her Ambien and her Alprazolam.  Her hot flashes have improved some on the HRT.  She is stil going thru a lot of family stressors.  She is still irritable and anxious.  She has poor sleep and racing thoughts at night in addition to pain from cervical and lumbar DDD.    She has not improved with Tramadol.  Flexeril was not helping.    Current Medications (verified): 1)  Benazepril-Hydrochlorothiazide 20-12.5 Mg  Tabs (Benazepril-Hydrochlorothiazide) .Marland Kitchen.. 1 Tab By Mouth Two Times A Day 2)  Simvastatin 20 Mg  Tabs (Simvastatin) .Marland Kitchen.. 1 Tab By Mouth Qhs 3)  Omeprazole 20 Mg Cpdr (Omeprazole) .... By Mouth Once Daily 4)  Zolpidem Tartrate 10 Mg Tabs (Zolpidem Tartrate) .Marland Kitchen.. 1 Tab By Mouth At Bedtime As Needed Sleep 5)  Cyclobenzaprine Hcl 5 Mg  Tabs (Cyclobenzaprine Hcl) .Marland Kitchen.. 1-2 Tabs By Mouth At Bedtime As Needed Back Pain 6)  Budeprion Xl 300 Mg Xr24h-Tab (Bupropion Hcl) .Marland Kitchen.. 1 Tab By Mouth Daily 7)  Alprazolam 1 Mg Tabs (Alprazolam) .Marland Kitchen.. 1 Tab By Mouth Two Times A Day As Needed Anxiety 8)  Tramadol Hcl 50 Mg Tabs (Tramadol Hcl) .Marland Kitchen.. 1 Tab By Mouth Two Times A Day As Needed Back Pain 9)  Estradiol 0.5 Mg Tabs  (Estradiol) .... Take 1 Tab By Mouth Every Morning 10)  Medroxyprogesterone Acetate 2.5 Mg Tabs (Medroxyprogesterone Acetate) .... Take 1 Tab By Mouth Every Morning  Allergies (verified): 1)  ! Codeine 2)  ! Penicillin  Past History:  Past Medical History: Reviewed history from 06/25/2010 and no changes required. plantar fasciits (seeing Dr Ihor Gully) Mild Lumbar DDD tobacco abuse impaired fasting glucose depression  Past Surgical History: Reviewed history from 07/29/2008 and no changes required. LESI under fluroscopy w/ Dr Yves Dill tonsillectomy, adenoids, and uvula removed for sleep apnea c/sec  tubal ligation  Family History: Reviewed history from 09/15/2008 and no changes required. 1 brother- healthy,  3 sisters- 1 w/ osteoporosis,  father HTN, depression, died AAA mother died of postmenopausal breast ca, age 54 MGF - diabetes  Social History: Reviewed history from 07/29/2008 and no changes required. 2nd marriage to husband Molly Maduro.  Retired from Engineer, production for [employer]. 2 yo son in Prison in [place]-- gets out in 2007.    Walks daily. Current Smoker.  Husband is also a smoker.   Review of Systems  See HPI  Physical Exam  General:  alert, well-developed, well-nourished, and well-hydrated.   Head:  normocephalic and atraumatic.   Mouth:  pharynx pink and moist.   Neck:  no masses.   Lungs:  Normal respiratory effort, chest expands symmetrically. Lungs are clear to auscultation, no crackles or wheezes. Heart:  Normal rate and regular rhythm. S1 and S2 normal without gallop, murmur, click, rub or other extra sounds. Skin:  color normal.   Psych:  depressed affect, tearful, and moderately anxious.     Impression & Recommendations:  Problem # 1:  DEPRESSION, MAJOR, RECURRENT (ICD-296.30) Assessment Deteriorated PHQ -9 score increased from 17--> 19 on higher dose of Wellbutrin and the start of HRT.  She still has poor sleep and I am  concerned about her doubling up on her Zolpidem and Ambien w/o instruction to do so.   We discussed the dangers of doing this and I am going to stop both of these meds.  Will add Zoloft in the AM and Seroquel at night.  Continue wellbutrin for now.  Psych referral made.  She is to continue coiunseling.  Verbally contracts that she will not harm herself. She has cut back on her ETOH but is not exercising.   RTC in 6 wks for f/u. Orders: Psychiatric Referral (Psych)  Problem # 2:  DISC DISEASE, LUMBAR (ICD-722.52) Chronic neck and back pain from DDD. Can use Aleve and Soma as needed for pain.  Flexeril and tramadol were not helping. Given ongoing psych issues iwth pt overtaking RX meds, I am NOT going to give her narcotics. She is to work on improving her exercise.  Complete Medication List: 1)  Benazepril-hydrochlorothiazide 20-12.5 Mg Tabs (Benazepril-hydrochlorothiazide) .Marland Kitchen.. 1 tab by mouth two times a day 2)  Simvastatin 20 Mg Tabs (Simvastatin) .Marland Kitchen.. 1 tab by mouth qhs 3)  Omeprazole 20 Mg Cpdr (Omeprazole) .... By mouth once daily 4)  Cyclobenzaprine Hcl 5 Mg Tabs (Cyclobenzaprine hcl) .Marland Kitchen.. 1-2 tabs by mouth at bedtime as needed back pain 5)  Budeprion Xl 300 Mg Xr24h-tab (Bupropion hcl) .Marland Kitchen.. 1 tab by mouth daily 6)  Estradiol 0.5 Mg Tabs (Estradiol) .... Take 1 tab by mouth every morning 7)  Medroxyprogesterone Acetate 2.5 Mg Tabs (Medroxyprogesterone acetate) .... Take 1 tab by mouth every morning 8)  Seroquel Xr 50 Mg Xr24h-tab (Quetiapine fumarate) .Marland Kitchen.. 1 tab by mouth every evening 9)  Sertraline Hcl 50 Mg Tabs (Sertraline hcl) .Marland Kitchen.. 1 tab by mouth qam 10)  Carisoprodol 350 Mg Tabs (Carisoprodol) .Marland Kitchen.. 1 tab by mouth three times a day as needed muscle pain  Patient Instructions: 1)  For mood: 2)  1) Referral made to Dr Epimenio Foot 3)  2) Add Sertraline 50 mg every morning and  4)  3) Seroquel XR 50 mg every evening. 5)  4) Stay on Wellbutrin 6)  5) Continue counseling. 7)  6)  Use Carisoprodol and Aleve as needed for neck/ back pain. 8)  Work on regular exericse. 9)  Return for follow up in 6 wks. Prescriptions: CARISOPRODOL 350 MG TABS (CARISOPRODOL) 1 tab by mouth three times a day as needed muscle pain  #60 x 0   Entered and Authorized by:   Seymour Bars DO   Signed by:   Seymour Bars DO on 09/17/2010   Method used:   Electronically to        [facility] ([facility])       [address]  [place], [place]  [address]       Ph: [phone]       Fax: [phone]   RxID:   [id number] SERTRALINE HCL 50 MG TABS (SERTRALINE HCL) 1 tab by mouth qAM  #30 x 1   Entered and Authorized by:   Seymour Bars DO   Signed by:   Seymour Bars DO on 09/17/2010   Method used:   Electronically to        [facility] ([facility]       [address]       [place], [place]  [address]       Ph: [phone]       Fax: [phone]   RxID:   [id number] SEROQUEL XR 50 MG XR24H-TAB (QUETIAPINE FUMARATE) 1 tab by mouth every evening  #30 x 1   Entered and Authorized by:   Seymour Bars DO   Signed by:   Seymour Bars DO on 09/17/2010   Method used:   Electronically to        [facility] ([facility]       [address]       [place], [place]  [address]       Ph: [phone]       Fax: [phone]   RxID:   [id number]

## 2011-01-11 NOTE — Progress Notes (Signed)
Summary: Sleep study referral  Phone Note Call from Patient Call back at Home Phone [phone]   Caller: Patient Call For: Rebecca Bars DO Summary of Call: Pt calls wanting to get a sleep study need order. Prefers [facility] Initial call taken by: Kathlene November LPN,  September 23, 2010 8:44 AM  Follow-up for Phone Call        done. Follow-up by: Rebecca Bars DO,  September 23, 2010 9:01 AM

## 2011-01-11 NOTE — Progress Notes (Signed)
Summary: Wants anxiety med increased and pain med  Phone Note Call from Patient Call back at Home Phone [phone]   Caller: Patient Call For: Seymour Bars DO Summary of Call: Pt LM on Michelle VM wanting to know if you would increase her dose of Xanax until the Wellbutrin kicks in. Also has lower back pain and in past you gave her Darvocet for this but they no longer make it so wanted to know if you would call her in something else for the back pain. Initial call taken by: Kathlene November,  July 09, 2010 1:37 PM  Follow-up for Phone Call        I can't give her more Xanax just yet.  #45 given on 7-15.  She can take 2 tabs up to 2 x a day until Wellbutrin kicks in.  Call when needing a RF.  This is only for short term use.  I will send in RX Tramdadol for her back pain. Follow-up by: Seymour Bars DO,  July 12, 2010 8:09 AM    New/Updated Medications: TRAMADOL HCL 50 MG TABS (TRAMADOL HCL) 1 tab by mouth two times a day as needed back pain Prescriptions: TRAMADOL HCL 50 MG TABS (TRAMADOL HCL) 1 tab by mouth two times a day as needed back pain  #60 x 0   Entered and Authorized by:   Seymour Bars DO   Signed by:   Seymour Bars DO on 07/12/2010   Method used:   Electronically to        [facility]       [address]       [place], [place]  [address]       Ph: [phone]       Fax: [phone]   RxID:   [id number]   Appended Document: Wants anxiety med increased and pain med [facility] for Pt to CB.Arvilla Market CMA, Michelle July 12, 2010 12:36 PM   Pt aware of the above.Arvilla Market CMA, Michelle July 12, 2010 12:58 PM

## 2011-03-11 ENCOUNTER — Other Ambulatory Visit: Payer: Self-pay | Admitting: Family Medicine

## 2011-04-26 NOTE — Assessment & Plan Note (Signed)
NAME:  Rebecca Austin, Rebecca Austin NO.:  [id number]   MEDICAL RECORD NO.:  [id number]          PATIENT TYPE:  POB   LOCATION:  [facility] [place]         FACILITY:  [facility]   PHYSICIAN:  Allie Bossier, MD        DATE OF BIRTH:  1951/02/05   DATE OF SERVICE:  08/31/2010                                  CLINIC NOTE   Rebecca Austin is a 60 year old married white gravida 3, para 1, abortus 2,  she has a 62 year old son.  She comes here as a referral from Dr. Cathey Endow.  Her complaints are multiple and varied but include moodiness, hot  flashes, insomnia, and some memory loss.  She has already seen Dr. Cathey Endow  with this set of complaints.  With regard to her GYN history, she went  through menopause in December 2004.  She has been on hormone replacement  therapy for approximately 5 years and notes that hormone replacement  therapy did decrease the severity of her hot flashes.  She has been off  of hormone-replacement therapy for the last 5 years and is interested in  restarting.   PAST MEDICAL HISTORY:  Significant for longstanding depression and she  is already being seen by Dr. Cathey Endow for this issue.  She is also  scheduled to be seen by a counselor in the near future.  Irritable  bowel.   PAST SURGICAL HISTORY:  She had 2 elective abortions.   FAMILY HISTORY:  Positive for breast cancer in her mother, who died in  her 2s.  She denies a family history of GYN or colon malignancies.   SOCIAL HISTORY:  She had a 20-pack-year history of smoking, but she quit  in April of this year along with her husband.   MEDICATIONS:  Benazepril HCTZ 20/12.5 mg daily, simvastatin 20 mg daily,  omeprazole 20 mg daily, zolpidem 10 mg at bedtime, cyclobenzaprine 5 mg,  bupropion 130 mg, alprazolam 1 mg b.i.d. p.r.n., tramadol 5 mg b.i.d.  p.r.n., vitamin D.   ALLERGIES:  No latex allergies.  Drug allergies are CODEINE and  PENICILLIN.   REVIEW OF SYSTEMS:  She is on her second marriage.  She is  currently  unemployed.  She says that she has not had intercourse for the last  year.  Her Pap smear is due.  Her mammogram was done last month and was  reportedly normal.  Her colonoscopy, irritable bowel.   PHYSICAL EXAMINATION:  GENERAL:  Well-nourished, well-hydrated, very  tearful white female.  VITAL SIGNS:  Height 5 feet 3 inches, weight 171, blood pressure 128/82,  pulse 92.  HEENT:  Normal.  BREAST:  Normal bilaterally.  HEART:  Regular rate and rhythm.  LUNGS:  Clear to auscultation bilaterally.  ABDOMEN:  Moderately obese.  No palpable hepatosplenomegaly.  EXTERNAL GENITALIA:  No lesions.  Cervix appears normal.  Uterus upper  limits of normal for size, anteverted, mobile, nontender.  Adnexa  nontender.  No masses.   ASSESSMENT AND PLAN:  1. Annual exam.  I have checked Pap smear.  Recommend self-breast and      self vulvar exams monthly.  I have also recommended that she  schedule a gastroenterology appointment for a repeat colonoscopy.  2. Symptomatic menopausal symptoms.  I have counseled her regarding      the average risk of breast cancer versus the slight increase when      you add hormone replacement therapy.  I told her that if her      symptoms outweigh the risks, then I would be willing to write her      prescription for combination hormone replacement therapy if she      does wish to try this.  I have also told her that should the      hormones not help her symptoms, that I can work in conjunction with      her behavioral therapist and her family doctor and we could try      using a selective serotonin reuptake inhibitor for her depression      as well as her hot flashes.      Allie Bossier, MD     MCD/MEDQ  D:  08/31/2010  T:  09/01/2010  Job:  [id number]   cc:   Dr. Cathey Endow

## 2011-05-17 ENCOUNTER — Encounter: Payer: Self-pay | Admitting: Family Medicine

## 2011-05-17 ENCOUNTER — Other Ambulatory Visit: Payer: Self-pay | Admitting: Family Medicine

## 2011-05-17 DIAGNOSIS — F329 Major depressive disorder, single episode, unspecified: Secondary | ICD-10-CM

## 2011-05-17 NOTE — Telephone Encounter (Signed)
Pt called and needs refills of her medications for bupropion and sertraline, however the patient was last seen for depression in 09-17-10, and at that time was instructed to follow up in 6 weeks and she never came in.   Plan:  Therefore, an appt for 05-18-11 @ 1:00pm was sched and pt told to ask for refills at her appt.  Pt voiced understanding. Jarvis Newcomer, LPN [facility]

## 2011-05-18 ENCOUNTER — Encounter: Payer: Self-pay | Admitting: Family Medicine

## 2011-05-18 ENCOUNTER — Ambulatory Visit (INDEPENDENT_AMBULATORY_CARE_PROVIDER_SITE_OTHER): Payer: BC Managed Care – PPO | Admitting: Family Medicine

## 2011-05-18 DIAGNOSIS — R635 Abnormal weight gain: Secondary | ICD-10-CM

## 2011-05-18 DIAGNOSIS — E785 Hyperlipidemia, unspecified: Secondary | ICD-10-CM

## 2011-05-18 DIAGNOSIS — I10 Essential (primary) hypertension: Secondary | ICD-10-CM

## 2011-05-18 NOTE — Patient Instructions (Addendum)
Stay on current meds.  Call me if you want to see Dr  Lions for sleep.  Feel free to continue counseling downstairs as needed.  Update fasting labs 8-1 or after.  Will call you w/ results.  F/u in 4 mos.

## 2011-05-18 NOTE — Progress Notes (Signed)
  Subjective:    Patient ID: Rebecca Austin, female    DOB: Jun 04, 1951, 60 y.o.   MRN: [id number]  HPI 60 yo WF presents for f/u visit.  She was having a lot of depression and anxiety in the fall which has improved on Zoloft, wellbutrin and seroquel.  She is still having dissorded sleeping with Seroquel at night.  She has a good support system. Seeing Dr Marice Potter for pap smear/ mammogram and HRT.  She has been off cigarettes for 1 yr.  She did gain weight but just started walking 3 mi 5 days/ wk a month ago.  Denies chest pain or SOB with exercise.  She is doing well on her meds.    BP 113/69  Pulse 85  Ht 5\' 4"  (1.626 m)  Wt 184 lb (83.462 kg)  BMI 31.58 kg/m2  SpO2 93%    Review of Systems  Constitutional: Negative for fever, fatigue and unexpected weight change.  Eyes: Positive for visual disturbance.  Respiratory: Negative for shortness of breath.   Cardiovascular: Negative for chest pain, palpitations and leg swelling.  Neurological: Negative for headaches.       Objective:   Physical Exam  Constitutional: She appears well-developed and well-nourished. No distress.       obese  Neck: Neck supple. No thyromegaly present.  Cardiovascular: Normal rate, regular rhythm and normal heart sounds.   No murmur heard. Pulmonary/Chest: Effort normal and breath sounds normal. No respiratory distress. She has no wheezes.  Musculoskeletal: She exhibits no edema.  Lymphadenopathy:    She has no cervical adenopathy.  Skin: Skin is warm and dry.  Psychiatric: She has a normal mood and affect.          Assessment & Plan:

## 2011-06-06 ENCOUNTER — Other Ambulatory Visit: Payer: Self-pay | Admitting: Family Medicine

## 2011-06-13 ENCOUNTER — Other Ambulatory Visit: Payer: Self-pay | Admitting: Family Medicine

## 2011-06-15 ENCOUNTER — Other Ambulatory Visit: Payer: Self-pay | Admitting: Family Medicine

## 2011-07-11 ENCOUNTER — Other Ambulatory Visit: Payer: Self-pay | Admitting: Family Medicine

## 2011-07-15 ENCOUNTER — Telehealth: Payer: Self-pay | Admitting: Family Medicine

## 2011-07-15 LAB — COMPLETE METABOLIC PANEL WITH GFR
Alkaline Phosphatase: 60 U/L (ref 39–117)
BUN: 13 mg/dL (ref 6–23)
Calcium: 9.1 mg/dL (ref 8.4–10.5)
GFR, Est African American: >60 mL/min (ref >60)
GFR, Est Non African American: 53 mL/min — ABNORMAL LOW (ref >60)
Glucose, Bld: 104 mg/dL — ABNORMAL HIGH (ref 70–99)
Potassium: 3.5 mEq/L (ref 3.5–5.3)
Sodium: 132 mEq/L — ABNORMAL LOW (ref 135–145)
Total Protein: 6.5 g/dL (ref 6.0–8.3)

## 2011-07-15 LAB — LIPID PANEL
Cholesterol: 159 mg/dL (ref 0–200)
HDL: 37 mg/dL — ABNORMAL LOW (ref >39)
LDL Cholesterol: 94 mg/dL (ref 0–99)
Triglycerides: 141 mg/dL (ref <150)

## 2011-07-15 LAB — CBC WITH DIFFERENTIAL/PLATELET
Eosinophils Relative: 2 % (ref 0–5)
Hemoglobin: 13 g/dL (ref 12.0–15.0)
Lymphocytes Relative: 32 % (ref 12–46)
Lymphs Abs: 1.8 10*3/uL (ref 0.7–4.0)
MCV: 87.4 fL (ref 78.0–100.0)
Monocytes Absolute: 0.6 10*3/uL (ref 0.1–1.0)
Neutrophils Relative %: 56 % (ref 43–77)
Platelets: 244 10*3/uL (ref 150–400)
RDW: 13.7 % (ref 11.5–15.5)

## 2011-07-15 NOTE — Telephone Encounter (Signed)
LMOM informing Pt  

## 2011-07-15 NOTE — Telephone Encounter (Signed)
Pls see if pt can return for f/u in the next 1-2 wks to discuss lab results.

## 2011-07-19 ENCOUNTER — Encounter: Payer: Self-pay | Admitting: Family Medicine

## 2011-07-19 ENCOUNTER — Ambulatory Visit (INDEPENDENT_AMBULATORY_CARE_PROVIDER_SITE_OTHER): Payer: BC Managed Care – PPO | Admitting: Family Medicine

## 2011-07-19 VITALS — BP 119/73 | HR 72 | Ht 64.0 in | Wt 174.0 lb

## 2011-07-19 DIAGNOSIS — E871 Hypo-osmolality and hyponatremia: Secondary | ICD-10-CM | POA: Insufficient documentation

## 2011-07-19 MED ORDER — QUETIAPINE FUMARATE ER 200 MG PO TB24: 200.0000 mg | ORAL_TABLET | Freq: Every day | ORAL | Status: DC

## 2011-07-19 MED ORDER — TELMISARTAN 80 MG PO TABS: 80.0000 mg | ORAL_TABLET | Freq: Every day | ORAL | Status: DC

## 2011-07-19 NOTE — Progress Notes (Signed)
  Subjective:    Patient ID: Rebecca Austin, female    DOB: 1951-02-21, 60 y.o.   MRN: [id number]  HPI  60 yo WF presents for f/u labs.  Her sodium was  Bit low at 132 on her recent chemistry panel with a  Drop in her GFR to 53.  She has been taking Benazepril/ HCTZ bid for her HTN.  Denies preceeding leg edema problems.  Feeling well.  Denies HAs or dizziness.  Sugar was still in the pre diabetic range.  BP 119/73  Pulse 72  Ht 5\' 4"  (1.626 m)  Wt 174 lb (78.926 kg)  BMI 29.87 kg/m2    Review of Systems  Constitutional: Negative for fever, fatigue and unexpected weight change.  Cardiovascular: Negative for palpitations and leg swelling.  Neurological: Negative for headaches.       Objective:   Physical Exam  Constitutional: She appears well-developed and well-nourished. No distress.  Psychiatric: She has a normal mood and affect.          Assessment & Plan:

## 2011-07-19 NOTE — Patient Instructions (Signed)
Increase Seroquel XR to 200 mg once daily.  Change BP medicine to Micardis 80 mg once daily.  Repeat BMP in 4 wks.  Return for f/u mood/ BP in 3 mos.

## 2011-07-19 NOTE — Assessment & Plan Note (Signed)
Reviewed labs.  I will stop her benazepril hctz due to drop in sodium and increase in GFR.  Will change to Micardis 80 mg /day.  Call if any problems.  This may also improve her IFG problems.  Repeat BMP in 4 wks, lab order given.  RTC for f/u in 3 mos.

## 2011-08-19 ENCOUNTER — Other Ambulatory Visit: Payer: Self-pay | Admitting: *Deleted

## 2011-08-19 MED ORDER — SERTRALINE HCL 50 MG PO TABS: 50.0000 mg | ORAL_TABLET | Freq: Every day | ORAL | Status: DC

## 2011-08-19 MED ORDER — BUPROPION HCL ER (XL) 300 MG PO TB24: 300.0000 mg | ORAL_TABLET | ORAL | Status: DC

## 2011-09-08 ENCOUNTER — Telehealth: Payer: Self-pay | Admitting: Family Medicine

## 2011-09-08 ENCOUNTER — Other Ambulatory Visit: Payer: Self-pay | Admitting: Family Medicine

## 2011-09-08 DIAGNOSIS — Z1211 Encounter for screening for malignant neoplasm of colon: Secondary | ICD-10-CM

## 2011-09-08 DIAGNOSIS — Z1231 Encounter for screening mammogram for malignant neoplasm of breast: Secondary | ICD-10-CM

## 2011-09-08 NOTE — Telephone Encounter (Signed)
Pt called and left message for the triage nurse and said she received a notification from her insurance company stating she will need a referral for her colon cancer screening. Plan:  Routed to Dr. Linford Arnold. Jarvis Newcomer, LPN [facility]

## 2011-09-09 NOTE — Telephone Encounter (Signed)
REferral made 

## 2011-09-09 NOTE — Telephone Encounter (Signed)
Pt notified and had to [facility] for her letting her know that referral had been made for colon cancer screening.  Told her someone should be notifiying her in the near future to get scheduled. Jarvis Newcomer, LPN [facility]

## 2011-09-13 ENCOUNTER — Ambulatory Visit
Admission: RE | Admit: 2011-09-13 | Discharge: 2011-09-13 | Disposition: A | Discharge status: Home / Self Care | Payer: BC Managed Care – PPO | Source: Ambulatory Visit | Attending: Family Medicine | Admitting: Family Medicine

## 2011-09-13 DIAGNOSIS — Z1231 Encounter for screening mammogram for malignant neoplasm of breast: Secondary | ICD-10-CM

## 2011-09-14 LAB — BASIC METABOLIC PANEL WITH GFR
CO2: 24 mEq/L (ref 19–32)
Calcium: 9.1 mg/dL (ref 8.4–10.5)
Creat: 0.92 mg/dL (ref 0.50–1.10)
GFR, Est African American: >60 mL/min (ref >60)
GFR, Est Non African American: >60 mL/min (ref >60)
Sodium: 143 mEq/L (ref 135–145)

## 2011-09-16 ENCOUNTER — Telehealth: Payer: Self-pay | Admitting: *Deleted

## 2011-09-16 NOTE — Telephone Encounter (Signed)
left message on vm

## 2011-09-16 NOTE — Telephone Encounter (Signed)
Message copied by Wyline Beady on Fri Sep 16, 2011  9:07 AM ------      Message from: Nani Gasser D      Created: Wed Sep 14, 2011  6:29 PM       Please call patient. Normal mammogram.  Repeat in 1 year.

## 2011-09-20 ENCOUNTER — Ambulatory Visit (INDEPENDENT_AMBULATORY_CARE_PROVIDER_SITE_OTHER): Payer: BC Managed Care – PPO | Admitting: Family Medicine

## 2011-09-20 ENCOUNTER — Encounter: Payer: Self-pay | Admitting: Family Medicine

## 2011-09-20 VITALS — BP 132/74 | HR 74 | Wt 172.0 lb

## 2011-09-20 DIAGNOSIS — Z23 Encounter for immunization: Secondary | ICD-10-CM

## 2011-09-20 DIAGNOSIS — I10 Essential (primary) hypertension: Secondary | ICD-10-CM

## 2011-09-20 MED ORDER — LOSARTAN POTASSIUM 100 MG PO TABS: 100.0000 mg | ORAL_TABLET | Freq: Every day | ORAL | Status: DC

## 2011-09-20 NOTE — Progress Notes (Signed)
  Subjective:    Patient ID: Rebecca Austin, female    DOB: 08/31/1951, 60 y.o.   MRN: [id number]  Hypertension This is a chronic problem. The current episode started more than 1 month ago. The problem has been resolved since onset. Pertinent negatives include no chest pain or shortness of breath. There are no associated agents to hypertension. Risk factors for coronary artery disease include no known risk factors. Past treatments include angiotensin blockers. The current treatment provides mild improvement. There are no compliance problems.   she would like to change to a cheaper meds. No SE, tolerating well. Says it is very expensive     Review of Systems  Respiratory: Negative for shortness of breath.   Cardiovascular: Negative for chest pain.       Objective:   Physical Exam  Constitutional: She is oriented to person, place, and time. She appears well-developed and well-nourished.  HENT:  Head: Normocephalic and atraumatic.  Cardiovascular: Normal rate, regular rhythm and normal heart sounds.        No carotid bruits.   Pulmonary/Chest: Effort normal and breath sounds normal.  Musculoskeletal: She exhibits no edema.  Neurological: She is alert and oriented to person, place, and time.  Skin: Skin is warm and dry.  Psychiatric: She has a normal mood and affect. Her behavior is normal.          Assessment & Plan:  HTN- Looks great.  Will change to losartan for cost reason. F/U in 6 mo. H.O on low salt diet.   Insomnia / mood - Will see if have coupons on the seroquel to make it more affordable. The higher dose has really been working very well for her .    Given flu and Tdap today.

## 2011-09-20 NOTE — Patient Instructions (Signed)
1.5 Gram Low Sodium Diet A 1.5 gram sodium diet restricts the amount of sodium in the diet to no more than 1.5 grams (g) or 1500 milligrams (mg) daily. The American Heart Association recommends Americans over the age of 20 to consume no more than 1500 mg of sodium each day to reduce the risk of developing high blood pressure. Research also shows that limiting sodium may reduce heart attack and stroke risk. Many foods contain sodium for flavor and sometimes as a preservative. When the amount of sodium in a diet needs to be low, it is important to know what to look for when choosing foods and drinks. The following includes some information and guidelines to help make it easier for you to adapt to a low sodium diet. QUICK TIPS  Do not add salt to food.   Avoid convenience items and fast food.   Choose unsalted snack foods.   Buy lower sodium products, often labeled as "lower sodium" or "no salt added."   Check food labels to learn how much sodium is in 1 serving.   When eating at a restaurant, ask that your food be prepared with less salt or none, if possible.  READING FOOD LABELS FOR SODIUM INFORMATION The nutrition facts label is a good place to find how much sodium is in foods. Look for products with no more than 400 mg of sodium per serving. Remember that 1.5 g = 1500 mg. The food label may also list foods as:  Sodium-free: Less than 5 mg in a serving.   Very low sodium: 35 mg or less in a serving.   Low-sodium: 140 mg or less in a serving.   Light in sodium: 50% less sodium in a serving. For example, if a food that usually has 300 mg of sodium is changed to become light in sodium, it will have 150 mg of sodium.   Reduced sodium: 25% less sodium in a serving. For example, if a food that usually has 400 mg of sodium is changed to reduced sodium, it will have 300 mg of sodium.  CHOOSING FOODS AVOID  CHOOSE   Grains:  Salted crackers and snack items. Some cereals, including instant hot  cereals. Bread stuffing and biscuit mixes. Seasoned rice or pasta mixes.  Grains:  Unsalted snack items. Low-sodium cereals, oats, puffed wheat and rice, shredded wheat. English muffins and bread. Pasta.   Meats:  Salted, canned, smoked, spiced, pickled meats, including fish and poultry. Bacon, ham, sausage, cold cuts, hot dogs, anchovies.  Meats:  Low-sodium canned tuna and salmon. Fresh or frozen meat, poultry, and fish.   Dairy:  Processed cheese and spreads. Cottage cheese. Buttermilk and condensed milk. Regular cheese.  Dairy:  Milk. Low-sodium cottage cheese. Yogurt. Sour cream. Low-sodium cheese.   Fruits and Vegetables:  Regular canned vegetables. Regular canned tomato sauce and paste. Frozen vegetables in sauces. Olives. Pickles. Relishes. Sauerkraut.  Fruits and Vegetables:  Low-sodium canned vegetables. Low-sodium tomato sauce and paste. Frozen or fresh vegetables. Fresh and frozen fruit.   Condiments:  Canned and packaged gravies. Worcestershire sauce. Tartar sauce. Barbecue sauce. Soy sauce. Steak sauce. Ketchup. Onion, garlic, and table salt. Meat flavorings and tenderizers.  Condiments:  Fresh and dried herbs and spices. Low-sodium varieties of mustard and ketchup. Lemon juice. Tabasco sauce. Horseradish.   SAMPLE 1.5 GRAM SODIUM MEAL PLAN Meal  Foods  Sodium (mg)   Breakfast  1 cup low-fat milk  1 whole-wheat English muffin 1 tablespoon heart-healthy margarine 1 hard-boiled egg   1 small orange  143  [phone]   Lunch  1 cup raw carrots  2 tablespoons no salt added peanut butter 2 slices whole-wheat bread 1 tablespoon jelly  cup red grapes  76  5 [phone] 6 2   Dinner  1 cup whole-wheat pasta  1 cup low-sodium tomato sauce 3 ounces (oz) lean ground beef 1 small side salad (1 cup raw spinach leaves,  cup cucumber,  cup yellow bell pepper) with 1 teaspoon olive oil and 1 teaspoon red wine vinegar  2  73 [phone] 25   Snack  1  container low-fat vanilla yogurt  3 graham cracker squares  107  127   Nutrient Analysis Calories: 1745 Protein: 75 g Carbohydrate: 237 g Fat: 57 g Sodium: 1425 mg Information from www.eatright.org. Document Released: 11/28/2005 Document Re-Released: 05/18/2010 [employer] Patient Information 2011 [employer]. 

## 2011-10-21 ENCOUNTER — Encounter: Payer: Self-pay | Admitting: Family Medicine

## 2011-11-05 ENCOUNTER — Other Ambulatory Visit: Payer: Self-pay | Admitting: Family Medicine

## 2011-12-12 ENCOUNTER — Other Ambulatory Visit: Payer: Self-pay | Admitting: *Deleted

## 2011-12-12 MED ORDER — QUETIAPINE FUMARATE ER 200 MG PO TB24: 200.0000 mg | ORAL_TABLET | Freq: Every day | ORAL | Status: DC

## 2011-12-22 ENCOUNTER — Other Ambulatory Visit: Payer: Self-pay | Admitting: *Deleted

## 2011-12-22 NOTE — Telephone Encounter (Signed)
OK, once.

## 2011-12-22 NOTE — Telephone Encounter (Signed)
Pt states that when she was being seen by Dr. Cathey Endow she had her on Fluocinonide 0.5% for eczema. She would like to see if she can get a refill on this med b/c it works well for her.

## 2011-12-23 ENCOUNTER — Other Ambulatory Visit: Payer: Self-pay | Admitting: *Deleted

## 2011-12-23 MED ORDER — FLUOCINONIDE 0.05 % EX CREA: TOPICAL_CREAM | Freq: Two times a day (BID) | CUTANEOUS | Status: DC

## 2011-12-23 NOTE — Telephone Encounter (Signed)
I tried to find this med under meds and orders but had no luck. Please help.

## 2011-12-23 NOTE — Telephone Encounter (Signed)
rx sent

## 2012-01-02 ENCOUNTER — Other Ambulatory Visit: Payer: Self-pay | Admitting: *Deleted

## 2012-01-02 MED ORDER — OMEPRAZOLE 20 MG PO CPDR: 20.0000 mg | DELAYED_RELEASE_CAPSULE | Freq: Every day | ORAL | Status: DC

## 2012-02-06 ENCOUNTER — Other Ambulatory Visit: Payer: Self-pay | Admitting: *Deleted

## 2012-02-06 MED ORDER — BUPROPION HCL ER (XL) 300 MG PO TB24: 300.0000 mg | ORAL_TABLET | ORAL | Status: DC

## 2012-02-06 MED ORDER — SERTRALINE HCL 50 MG PO TABS: 50.0000 mg | ORAL_TABLET | Freq: Every day | ORAL | Status: DC

## 2012-02-06 MED ORDER — QUETIAPINE FUMARATE ER 200 MG PO TB24
200.0000 mg | ORAL_TABLET | Freq: Every day | ORAL | Status: DC
Start: 2012-02-06 — End: 2012-04-05

## 2012-02-12 ENCOUNTER — Other Ambulatory Visit: Payer: Self-pay | Admitting: Obstetrics & Gynecology

## 2012-02-28 ENCOUNTER — Encounter: Payer: Self-pay | Admitting: Obstetrics & Gynecology

## 2012-02-28 ENCOUNTER — Ambulatory Visit (INDEPENDENT_AMBULATORY_CARE_PROVIDER_SITE_OTHER): Payer: BC Managed Care – PPO | Admitting: Obstetrics & Gynecology

## 2012-02-28 VITALS — BP 147/87 | HR 77 | Temp 98.5°F | Resp 16 | Wt 167.0 lb

## 2012-02-28 DIAGNOSIS — Z124 Encounter for screening for malignant neoplasm of cervix: Secondary | ICD-10-CM

## 2012-02-28 DIAGNOSIS — Z0000 Encounter for general adult medical examination without abnormal findings: Secondary | ICD-10-CM

## 2012-02-28 DIAGNOSIS — Z01419 Encounter for gynecological examination (general) (routine) without abnormal findings: Secondary | ICD-10-CM

## 2012-02-28 MED ORDER — MEDROXYPROGESTERONE ACETATE 2.5 MG PO TABS: 2.5000 mg | ORAL_TABLET | Freq: Every day | ORAL | Status: DC

## 2012-02-28 MED ORDER — ESTRADIOL 0.5 MG PO TABS: 1.0000 mg | ORAL_TABLET | Freq: Every day | ORAL | Status: DC

## 2012-02-28 NOTE — Progress Notes (Signed)
Subjective:    Rebecca Austin is a 62 y.o. female who presents for an annual exam. The patient has no complaints today. She would like refills on her HRT but would like the estrogen increased because of some hot flashes.The patient is sexually active. (rarely) GYN screening history: last pap: was normal. The patient wears seatbelts: yes. The patient participates in regular exercise: yes (walks 25 miles per week). Has the patient ever been transfused or tattooed?: no. The patient reports that there is not domestic violence in her life.   Menstrual History: OB History    Grav Para Term Preterm Abortions TAB SAB Ect Mult Living                  Menarche age: 89 No LMP recorded. Patient is postmenopausal.    The following portions of the patient's history were reviewed and updated as appropriate: allergies, current medications, past family history, past medical history, past social history, past surgical history and problem list.  Review of Systems A comprehensive review of systems was negative.  She has been married to her second husband for 33 years. She had her colonoscopy and mammogram in the summer of 2012. She is retired.   Objective:    BP 147/87  Pulse 77  Temp(Src) 98.5 F (36.9 C) (Oral)  Resp 16  Wt 167 lb (75.751 kg)  General Appearance:    Alert, cooperative, no distress, appears stated age  Head:    Normocephalic, without obvious abnormality, atraumatic  Eyes:    PERRL, conjunctiva/corneas clear, EOM's intact, fundi    benign, both eyes  Ears:    Normal TM's and external ear canals, both ears  Nose:   Nares normal, septum midline, mucosa normal, no drainage    or sinus tenderness  Throat:   Lips, mucosa, and tongue normal; teeth and gums normal  Neck:   Supple, symmetrical, trachea midline, no adenopathy;    thyroid:  no enlargement/tenderness/nodules; no carotid   bruit or JVD  Back:     Symmetric, no curvature, ROM normal, no CVA tenderness  Lungs:     Clear to  auscultation bilaterally, respirations unlabored  Chest Wall:    No tenderness or deformity   Heart:    Regular rate and rhythm, S1 and S2 normal, no murmur, rub   or gallop  Breast Exam:    No tenderness, masses, or nipple abnormality  Abdomen:     Soft, non-tender, bowel sounds active all four quadrants,    no masses, no organomegaly  Genitalia:    Normal female without lesion, discharge or tenderness, ULN size, NT, no adnexal masses     Extremities:   Extremities normal, atraumatic, no cyanosis or edema  Pulses:   2+ and symmetric all extremities  Skin:   Skin color, texture, turgor normal, no rashes or lesions  Lymph nodes:   Cervical, supraclavicular, and axillary nodes normal  Neurologic:   CNII-XII intact, normal strength, sensation and reflexes    throughout  .    Assessment:    Healthy female exam.    Plan:     Pap smear.

## 2012-03-26 ENCOUNTER — Other Ambulatory Visit: Payer: Self-pay | Admitting: Family Medicine

## 2012-03-27 ENCOUNTER — Other Ambulatory Visit: Payer: Self-pay | Admitting: *Deleted

## 2012-03-27 MED ORDER — SIMVASTATIN 20 MG PO TABS: 20.0000 mg | ORAL_TABLET | Freq: Every day | ORAL | Status: DC

## 2012-04-05 ENCOUNTER — Other Ambulatory Visit: Payer: Self-pay | Admitting: Family Medicine

## 2012-05-15 ENCOUNTER — Other Ambulatory Visit: Payer: Self-pay | Admitting: Family Medicine

## 2012-06-10 ENCOUNTER — Other Ambulatory Visit: Payer: Self-pay | Admitting: Family Medicine

## 2012-07-02 ENCOUNTER — Other Ambulatory Visit: Payer: Self-pay | Admitting: Family Medicine

## 2012-08-05 ENCOUNTER — Other Ambulatory Visit: Payer: Self-pay | Admitting: Family Medicine

## 2012-08-06 ENCOUNTER — Telehealth: Payer: Self-pay | Admitting: *Deleted

## 2012-08-06 ENCOUNTER — Other Ambulatory Visit: Payer: Self-pay | Admitting: Obstetrics & Gynecology

## 2012-08-06 DIAGNOSIS — Z780 Asymptomatic menopausal state: Secondary | ICD-10-CM

## 2012-08-06 DIAGNOSIS — Z1231 Encounter for screening mammogram for malignant neoplasm of breast: Secondary | ICD-10-CM

## 2012-08-06 MED ORDER — ESTRADIOL 0.5 MG PO TABS: 1.0000 mg | ORAL_TABLET | Freq: Every day | ORAL | Status: DC

## 2012-08-06 NOTE — Telephone Encounter (Signed)
Pt called staing that her Estradiol was 2 tabs daily and that her RX was only for 31 which made her go to the pharmacy every 15 days.  RX changed to Disp # 60 or 1 month's supply.  She is due for annual in March 14.

## 2012-09-14 ENCOUNTER — Other Ambulatory Visit: Payer: Self-pay | Admitting: *Deleted

## 2012-09-14 MED ORDER — CARISOPRODOL 350 MG PO TABS: 350.0000 mg | ORAL_TABLET | Freq: Three times a day (TID) | ORAL | Status: DC | PRN

## 2012-09-14 NOTE — Telephone Encounter (Signed)
Ok for 30 tabs, no RF.

## 2012-09-14 NOTE — Telephone Encounter (Signed)
Pt calls and request a refill on Carisoprodol 350mg , says has been over 1 year since last had it. Looked in old system and its under meds: Carisoprodol 350mg  take one tab three times a day as needed for muscle pain

## 2012-09-25 ENCOUNTER — Ambulatory Visit: Payer: BC Managed Care – PPO

## 2012-09-25 ENCOUNTER — Ambulatory Visit (INDEPENDENT_AMBULATORY_CARE_PROVIDER_SITE_OTHER): Payer: BC Managed Care – PPO

## 2012-09-25 ENCOUNTER — Other Ambulatory Visit: Payer: Self-pay | Admitting: *Deleted

## 2012-09-25 DIAGNOSIS — R928 Other abnormal and inconclusive findings on diagnostic imaging of breast: Secondary | ICD-10-CM

## 2012-09-25 DIAGNOSIS — Z1231 Encounter for screening mammogram for malignant neoplasm of breast: Secondary | ICD-10-CM

## 2012-09-25 MED ORDER — SIMVASTATIN 20 MG PO TABS: 20.0000 mg | ORAL_TABLET | Freq: Every day | ORAL | Status: DC

## 2012-10-01 ENCOUNTER — Other Ambulatory Visit: Payer: Self-pay | Admitting: Obstetrics & Gynecology

## 2012-10-01 DIAGNOSIS — R928 Other abnormal and inconclusive findings on diagnostic imaging of breast: Secondary | ICD-10-CM

## 2012-10-02 ENCOUNTER — Ambulatory Visit
Admission: RE | Admit: 2012-10-02 | Discharge: 2012-10-02 | Disposition: A | Discharge status: Home / Self Care | Payer: BC Managed Care – PPO | Source: Ambulatory Visit | Attending: Obstetrics & Gynecology | Admitting: Obstetrics & Gynecology

## 2012-10-02 DIAGNOSIS — R928 Other abnormal and inconclusive findings on diagnostic imaging of breast: Secondary | ICD-10-CM

## 2012-10-06 ENCOUNTER — Other Ambulatory Visit: Payer: Self-pay | Admitting: Family Medicine

## 2012-10-09 ENCOUNTER — Other Ambulatory Visit: Payer: Self-pay | Admitting: *Deleted

## 2012-10-10 ENCOUNTER — Ambulatory Visit (INDEPENDENT_AMBULATORY_CARE_PROVIDER_SITE_OTHER): Payer: BC Managed Care – PPO | Admitting: Family Medicine

## 2012-10-10 ENCOUNTER — Encounter: Payer: Self-pay | Admitting: Family Medicine

## 2012-10-10 VITALS — BP 126/77 | HR 79 | Ht 62.0 in | Wt 171.0 lb

## 2012-10-10 DIAGNOSIS — F329 Major depressive disorder, single episode, unspecified: Secondary | ICD-10-CM

## 2012-10-10 DIAGNOSIS — I10 Essential (primary) hypertension: Secondary | ICD-10-CM

## 2012-10-10 DIAGNOSIS — G4700 Insomnia, unspecified: Secondary | ICD-10-CM

## 2012-10-10 DIAGNOSIS — E785 Hyperlipidemia, unspecified: Secondary | ICD-10-CM

## 2012-10-10 MED ORDER — QUETIAPINE FUMARATE 200 MG PO TABS: 200.0000 mg | ORAL_TABLET | Freq: Every day | ORAL | Status: DC

## 2012-10-10 NOTE — Progress Notes (Signed)
  Subjective:    Patient ID: Rebecca Austin, female    DOB: 1951/06/15, 61 y.o.   MRN: [id number]  HPI Doing well on seroquel. Using it for depression and sleep. Has been on 200XL for over a year.  No SE.  Would like to change to the generic.  She isn't sleeping well overall. She definitely wants to continue the medication at this point time.  HTN- no CP or SOB.  Dong well on losartan.    Depression doing well on wellbutrin, zoloft.  She's happy with her current regimen does not want to change it.  Hyperlipidemia - she reports that she is taking her simvastatin regularly without side effects or problems.  Review of Systems     Objective:   Physical Exam  Constitutional: She is oriented to person, place, and time. She appears well-developed and well-nourished.  HENT:  Head: Normocephalic and atraumatic.  Neck: Neck supple. No thyromegaly present.  Cardiovascular: Normal rate, regular rhythm and normal heart sounds.        No carotid bruits.   Pulmonary/Chest: Effort normal and breath sounds normal.  Lymphadenopathy:    She has no cervical adenopathy.  Neurological: She is alert and oriented to person, place, and time.  Skin: Skin is warm and dry.  Psychiatric: She has a normal mood and affect. Her behavior is normal.          Assessment & Plan:  Insomnia - currently well controlled on Seroquel. We will try changing her to the generic short release version. I did explain to her that it is not generic in the extended release which is what she is currently taking. We will try for at least a month and see if it works well for her.  HTN - Well controlled. Continue losartan. Followup in 6 months.  Hyperlipidemia  - due to repeat lipids. Given lab slip. She can go anytime. Continue simvastatin.  Depression - currently well controlled. Her PHQ 9 score is 2 which is fantastic. Continue current regimen. Followup in 6 months.

## 2012-10-14 ENCOUNTER — Other Ambulatory Visit: Payer: Self-pay | Admitting: Family Medicine

## 2012-10-17 ENCOUNTER — Telehealth: Payer: Self-pay | Admitting: Family Medicine

## 2012-10-17 ENCOUNTER — Other Ambulatory Visit: Payer: Self-pay | Admitting: *Deleted

## 2012-10-17 DIAGNOSIS — N951 Menopausal and female climacteric states: Secondary | ICD-10-CM

## 2012-10-17 MED ORDER — QUETIAPINE FUMARATE 200 MG PO TABS: 200.0000 mg | ORAL_TABLET | Freq: Every day | ORAL | Status: DC

## 2012-10-17 MED ORDER — LOSARTAN POTASSIUM 100 MG PO TABS: 100.0000 mg | ORAL_TABLET | Freq: Every day | ORAL | Status: DC

## 2012-10-17 MED ORDER — MEDROXYPROGESTERONE ACETATE 2.5 MG PO TABS: 2.5000 mg | ORAL_TABLET | Freq: Every day | ORAL | Status: DC

## 2012-10-17 MED ORDER — CARISOPRODOL 350 MG PO TABS: 350.0000 mg | ORAL_TABLET | Freq: Three times a day (TID) | ORAL | Status: DC | PRN

## 2012-10-17 MED ORDER — BUPROPION HCL ER (XL) 300 MG PO TB24: 300.0000 mg | ORAL_TABLET | ORAL | Status: DC

## 2012-10-17 NOTE — Telephone Encounter (Signed)
Pt's switching to [facility] for her scripts and will need to get 90 day supply when she needs meds.  She just wanted to call and give [place] a head's up.  You can contact her with any questions. I wasn't sure if she needed any meds right now or not, or if she was just giving [place] the new mail order details.  Thanks.

## 2012-10-22 ENCOUNTER — Other Ambulatory Visit: Payer: Self-pay | Admitting: *Deleted

## 2012-10-22 DIAGNOSIS — Z780 Asymptomatic menopausal state: Secondary | ICD-10-CM

## 2012-10-22 MED ORDER — ESTRADIOL 0.5 MG PO TABS: 1.0000 mg | ORAL_TABLET | Freq: Every day | ORAL | Status: DC

## 2012-10-22 MED ORDER — OMEPRAZOLE 20 MG PO CPDR: 20.0000 mg | DELAYED_RELEASE_CAPSULE | Freq: Every day | ORAL | Status: DC

## 2012-10-22 MED ORDER — SIMVASTATIN 20 MG PO TABS: 20.0000 mg | ORAL_TABLET | Freq: Every day | ORAL | Status: DC

## 2012-10-25 ENCOUNTER — Other Ambulatory Visit: Payer: Self-pay

## 2012-10-25 DIAGNOSIS — E785 Hyperlipidemia, unspecified: Secondary | ICD-10-CM

## 2012-10-25 MED ORDER — SIMVASTATIN 20 MG PO TABS: 20.0000 mg | ORAL_TABLET | Freq: Every day | ORAL | Status: DC

## 2012-11-01 LAB — LIPID PANEL
Cholesterol: 194 mg/dL (ref 0–200)
HDL: 45 mg/dL (ref >39)
Total CHOL/HDL Ratio: 4.3 Ratio
Triglycerides: 203 mg/dL — ABNORMAL HIGH (ref <150)

## 2012-11-01 LAB — COMPLETE METABOLIC PANEL WITH GFR
AST: 19 U/L (ref 0–37)
Albumin: 4.4 g/dL (ref 3.5–5.2)
Alkaline Phosphatase: 72 U/L (ref 39–117)
BUN: 10 mg/dL (ref 6–23)
Creat: 0.97 mg/dL (ref 0.50–1.10)
GFR, Est Non African American: 64 mL/min
Glucose, Bld: 96 mg/dL (ref 70–99)
Total Bilirubin: 0.6 mg/dL (ref 0.3–1.2)

## 2012-11-03 ENCOUNTER — Other Ambulatory Visit: Payer: Self-pay | Admitting: Family Medicine

## 2012-11-12 ENCOUNTER — Other Ambulatory Visit: Payer: Self-pay | Admitting: Family Medicine

## 2012-11-27 ENCOUNTER — Other Ambulatory Visit: Payer: Self-pay | Admitting: *Deleted

## 2012-11-30 ENCOUNTER — Other Ambulatory Visit: Payer: Self-pay | Admitting: *Deleted

## 2012-11-30 ENCOUNTER — Telehealth: Payer: Self-pay | Admitting: *Deleted

## 2012-11-30 MED ORDER — FLUTICASONE PROPIONATE 50 MCG/ACT NA SUSP: NASAL | Status: DC

## 2012-11-30 NOTE — Telephone Encounter (Signed)
Tx is a nasal steroid spray so that is works tipically to reduce swelling in the nose. Spray takes about 3 days to start taking full affect.  rx sent.

## 2012-11-30 NOTE — Telephone Encounter (Signed)
Pt notified of MD instructions

## 2012-11-30 NOTE — Telephone Encounter (Signed)
Pt calls and states she has found that she is addicted to OTC nasal spray- has noticied now that she can only go a short time between sprays to be able to breathe and it used to last a lot longer. She wants to know if she can get 1 weeks worth of prednisone to get her self off of it and promises she will not use it anymore

## 2012-12-07 ENCOUNTER — Other Ambulatory Visit: Payer: Self-pay | Admitting: *Deleted

## 2012-12-07 DIAGNOSIS — E785 Hyperlipidemia, unspecified: Secondary | ICD-10-CM

## 2012-12-07 MED ORDER — SIMVASTATIN 20 MG PO TABS: 20.0000 mg | ORAL_TABLET | Freq: Every day | ORAL | Status: DC

## 2012-12-13 ENCOUNTER — Other Ambulatory Visit: Payer: Self-pay | Admitting: *Deleted

## 2012-12-13 MED ORDER — FLUTICASONE PROPIONATE 50 MCG/ACT NA SUSP: NASAL | Status: DC

## 2012-12-13 MED ORDER — SERTRALINE HCL 50 MG PO TABS: 50.0000 mg | ORAL_TABLET | Freq: Every day | ORAL | Status: DC

## 2012-12-20 ENCOUNTER — Other Ambulatory Visit: Payer: Self-pay

## 2012-12-20 MED ORDER — BUPROPION HCL ER (XL) 300 MG PO TB24: 300.0000 mg | ORAL_TABLET | ORAL | Status: DC

## 2013-01-05 ENCOUNTER — Other Ambulatory Visit: Payer: Self-pay | Admitting: Family Medicine

## 2013-01-07 ENCOUNTER — Other Ambulatory Visit: Payer: Self-pay | Admitting: *Deleted

## 2013-01-07 DIAGNOSIS — E785 Hyperlipidemia, unspecified: Secondary | ICD-10-CM

## 2013-01-07 MED ORDER — SIMVASTATIN 20 MG PO TABS: 20.0000 mg | ORAL_TABLET | Freq: Every day | ORAL | Status: DC

## 2013-02-26 ENCOUNTER — Other Ambulatory Visit: Payer: Self-pay | Admitting: *Deleted

## 2013-02-26 MED ORDER — OMEPRAZOLE 20 MG PO CPDR: 20.0000 mg | DELAYED_RELEASE_CAPSULE | Freq: Every day | ORAL | Status: DC

## 2013-02-26 MED ORDER — LOSARTAN POTASSIUM 100 MG PO TABS: 100.0000 mg | ORAL_TABLET | Freq: Every day | ORAL | Status: DC

## 2013-03-09 ENCOUNTER — Other Ambulatory Visit: Payer: Self-pay | Admitting: Family Medicine

## 2013-03-11 ENCOUNTER — Other Ambulatory Visit: Payer: Self-pay | Admitting: Family Medicine

## 2013-03-12 ENCOUNTER — Telehealth: Payer: Self-pay | Admitting: *Deleted

## 2013-03-12 NOTE — Telephone Encounter (Signed)
Ok to refill until appt 

## 2013-03-12 NOTE — Telephone Encounter (Signed)
Pt had pharmacy send request refill for her Tresa Garter- I denied this as she needs appointment. Pt informed of this via VM. Pt calls office back and states she made appt for 4/22 which is first available per for you and wanted to know if you would refill the Soma until her appointment?

## 2013-03-13 ENCOUNTER — Other Ambulatory Visit: Payer: Self-pay | Admitting: *Deleted

## 2013-03-13 DIAGNOSIS — Z780 Asymptomatic menopausal state: Secondary | ICD-10-CM

## 2013-03-13 DIAGNOSIS — N951 Menopausal and female climacteric states: Secondary | ICD-10-CM

## 2013-03-13 DIAGNOSIS — E785 Hyperlipidemia, unspecified: Secondary | ICD-10-CM

## 2013-03-13 MED ORDER — MEDROXYPROGESTERONE ACETATE 2.5 MG PO TABS: 2.5000 mg | ORAL_TABLET | Freq: Every day | ORAL | Status: DC

## 2013-03-13 MED ORDER — SIMVASTATIN 20 MG PO TABS: 20.0000 mg | ORAL_TABLET | Freq: Every day | ORAL | Status: DC

## 2013-03-13 MED ORDER — ESTRADIOL 0.5 MG PO TABS: 1.0000 mg | ORAL_TABLET | Freq: Every day | ORAL | Status: DC

## 2013-03-13 MED ORDER — CARISOPRODOL 350 MG PO TABS: 350.0000 mg | ORAL_TABLET | Freq: Three times a day (TID) | ORAL | Status: DC | PRN

## 2013-03-13 NOTE — Telephone Encounter (Signed)
Request from [facility] mail order for RF on Estrogen and Progesterone.  Pt does have appt 03/19/13 with Dr Marice Potter

## 2013-03-19 ENCOUNTER — Ambulatory Visit (INDEPENDENT_AMBULATORY_CARE_PROVIDER_SITE_OTHER): Payer: BC Managed Care – PPO | Admitting: Obstetrics & Gynecology

## 2013-03-19 ENCOUNTER — Encounter: Payer: Self-pay | Admitting: Obstetrics & Gynecology

## 2013-03-19 VITALS — BP 150/92 | HR 79 | Temp 97.9°F | Resp 18 | Wt 171.0 lb

## 2013-03-19 DIAGNOSIS — Z0000 Encounter for general adult medical examination without abnormal findings: Secondary | ICD-10-CM

## 2013-03-19 DIAGNOSIS — Z01419 Encounter for gynecological examination (general) (routine) without abnormal findings: Secondary | ICD-10-CM

## 2013-03-19 DIAGNOSIS — Z124 Encounter for screening for malignant neoplasm of cervix: Secondary | ICD-10-CM

## 2013-03-19 DIAGNOSIS — Z1151 Encounter for screening for human papillomavirus (HPV): Secondary | ICD-10-CM

## 2013-03-19 LAB — POCT URINALYSIS DIPSTICK
Bilirubin, UA: NEGATIVE
Ketones, UA: NEGATIVE
Leukocytes, UA: NEGATIVE
Spec Grav, UA: 1.01

## 2013-03-19 MED ORDER — ESTRADIOL 1 MG PO TABS: 1.0000 mg | ORAL_TABLET | Freq: Every day | ORAL | Status: DC

## 2013-03-19 NOTE — Progress Notes (Signed)
Subjective:    Rebecca Austin is a 62 y.o. female who presents for an annual exam. The patient has no complaints today. She would like her estrogen increased due to continued hot flashes.  The patient is rarely sexually active due to some degree of ED. GYN screening history: last pap: was normal. The patient wears seatbelts: yes. The patient participates in regular exercise: yes. Has the patient ever been transfused or tattooed?: no. The patient reports that there is not domestic violence in her life.   Menstrual History: OB History   Grav Para Term Preterm Abortions TAB SAB Ect Mult Living                  Menarche age: 79  No LMP recorded. Patient is postmenopausal.    The following portions of the patient's history were reviewed and updated as appropriate: allergies, current medications, past family history, past medical history, past social history, past surgical history and problem list.  Review of Systems A comprehensive review of systems was negative. She has been married for 34 years. Her colonoscopy and mammogram are UTD as are her vaccinations. She is retired from [employer].   Objective:    BP 150/92  Pulse 79  Temp(Src) 97.9 F (36.6 C)  Resp 18  Wt 171 lb (77.565 kg)  BMI 31.27 kg/m2  SpO2 96%  General Appearance:    Alert, cooperative, no distress, appears stated age  Head:    Normocephalic, without obvious abnormality, atraumatic  Eyes:    PERRL, conjunctiva/corneas clear, EOM's intact, fundi    benign, both eyes  Ears:    Normal TM's and external ear canals, both ears  Nose:   Nares normal, septum midline, mucosa normal, no drainage    or sinus tenderness  Throat:   Lips, mucosa, and tongue normal; teeth and gums normal  Neck:   Supple, symmetrical, trachea midline, no adenopathy;    thyroid:  no enlargement/tenderness/nodules; no carotid   bruit or JVD  Back:     Symmetric, no curvature, ROM normal, no CVA tenderness  Lungs:     Clear to auscultation  bilaterally, respirations unlabored  Chest Wall:    No tenderness or deformity   Heart:    Regular rate and rhythm, S1 and S2 normal, no murmur, rub   or gallop  Breast Exam:    No tenderness, masses, or nipple abnormality  Abdomen:     Soft, non-tender, bowel sounds active all four quadrants,    no masses, no organomegaly  Genitalia:    Normal female without lesion, discharge or tenderness, minimal VV atrophy, NSSA, NT, mobile, normal adnexal exam. Normal vaginal discharge     Extremities:   Extremities normal, atraumatic, no cyanosis or edema  Pulses:   2+ and symmetric all extremities  Skin:   Skin color, texture, turgor normal, no rashes or lesions  Lymph nodes:   Cervical, supraclavicular, and axillary nodes normal  Neurologic:   CNII-XII intact, normal strength, sensation and reflexes    throughout  .    Assessment:    Healthy female exam.    Plan:     Pap smear.

## 2013-03-27 ENCOUNTER — Other Ambulatory Visit: Payer: Self-pay | Admitting: *Deleted

## 2013-03-27 MED ORDER — BUPROPION HCL ER (XL) 300 MG PO TB24: 300.0000 mg | ORAL_TABLET | ORAL | Status: DC

## 2013-04-02 ENCOUNTER — Ambulatory Visit (INDEPENDENT_AMBULATORY_CARE_PROVIDER_SITE_OTHER): Payer: BC Managed Care – PPO | Admitting: Family Medicine

## 2013-04-02 ENCOUNTER — Encounter: Payer: Self-pay | Admitting: Family Medicine

## 2013-04-02 ENCOUNTER — Telehealth: Payer: Self-pay | Admitting: *Deleted

## 2013-04-02 VITALS — BP 147/79 | HR 71 | Ht 62.0 in | Wt 171.0 lb

## 2013-04-02 DIAGNOSIS — Z0000 Encounter for general adult medical examination without abnormal findings: Secondary | ICD-10-CM

## 2013-04-02 DIAGNOSIS — H9190 Unspecified hearing loss, unspecified ear: Secondary | ICD-10-CM | POA: Insufficient documentation

## 2013-04-02 DIAGNOSIS — I10 Essential (primary) hypertension: Secondary | ICD-10-CM

## 2013-04-02 DIAGNOSIS — R7301 Impaired fasting glucose: Secondary | ICD-10-CM

## 2013-04-02 DIAGNOSIS — H9193 Unspecified hearing loss, bilateral: Secondary | ICD-10-CM

## 2013-04-02 LAB — POCT GLYCOSYLATED HEMOGLOBIN (HGB A1C): Hemoglobin A1C: 5.7

## 2013-04-02 MED ORDER — CARISOPRODOL 350 MG PO TABS: 350.0000 mg | ORAL_TABLET | Freq: Three times a day (TID) | ORAL | Status: DC | PRN

## 2013-04-02 MED ORDER — AMLODIPINE BESYLATE 2.5 MG PO TABS: 2.5000 mg | ORAL_TABLET | Freq: Every day | ORAL | Status: DC

## 2013-04-02 NOTE — Telephone Encounter (Signed)
Called pt to advice her that her labs were sent downstairs and at her convenience to go have them done.Rebecca Austin

## 2013-04-02 NOTE — Patient Instructions (Signed)
Start amlodipine 2.5 mg daily.  Follow up in about 6-8 weeks for blood pressure.

## 2013-04-02 NOTE — Progress Notes (Signed)
Subjective:     Rebecca Austin is a 62 y.o. female and is here for a comprehensive physical exam. The patient reports no problems. She wears contact lenses says her eye exam is up-to-date. She says she's not due until May. She typically goes to her eye. Her mammogram is up-to-date. She just had her Pap smear couple weeks ago with Dr. Wonda Amis. She also saw her dermatologist not that long ago and had a full skin check.  History   Social History  . Marital Status: Married    Spouse Name: N/A    Number of Children: 1  . Years of Education: N/A   Occupational History  . 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 Smoking status: Former Smoker    Types: Cigarettes    Quit date: 12/12/2009  . Smokeless tobacco: Never Used  . Alcohol Use: 0.6 oz/week    1 Glasses of wine per week  . Drug Use: No  . Sexually Active: Yes    Birth Control/ Protection: Post-menopausal     Comment: 2nd marriage,  63 yr old son in prison in [place] and gets out 07, walks daily.   Other Topics Concern  . Not on file   Social History Narrative   Working out regulary 3 times per week.    Health Maintenance  Topic Date Due  . Influenza Vaccine  08/12/2013  . Mammogram  10/02/2014  . Pap Smear  03/19/2016  . Colonoscopy  12/12/2020  . Tetanus/tdap  09/19/2021    The following portions of the patient's history were reviewed and updated as appropriate: allergies, current medications, past family history, past medical history, past social history, past surgical history and problem list.  Review of Systems A comprehensive review of systems was negative.   Objective:    BP 147/79  Pulse 71  Ht 5\' 2"  (1.575 m)  Wt 171 lb (77.565 kg)  BMI 31.27 kg/m2 General appearance: alert, cooperative and appears stated age Head: Normocephalic, without obvious abnormality, atraumatic Eyes: conj clear, EOMI, PEERLA Ears: normal TM's and external ear canals both ears Nose: Nares normal. Septum  midline. Mucosa normal. No drainage or sinus tenderness. Throat: lips, mucosa, and tongue normal; teeth and gums normal Neck: no adenopathy, no carotid bruit, no JVD, supple, symmetrical, trachea midline and thyroid not enlarged, symmetric, no tenderness/mass/nodules Back: symmetric, no curvature. ROM normal. No CVA tenderness. Lungs: clear to auscultation bilaterally Heart: regular rate and rhythm, S1, S2 normal, no murmur, click, rub or gallop Abdomen: soft, non-tender; bowel sounds normal; no masses,  no organomegaly Extremities: extremities normal, atraumatic, no cyanosis or edema Pulses: 2+ and symmetric Skin: Skin color, texture, turgor normal. No rashes or lesions Lymph nodes: Cervical adenopathy: nl and Axillary adenopathy: nl Neurologic: Alert and oriented X 3, normal strength and tone. Normal symmetric reflexes. Normal coordination and gait    Assessment:    Healthy female exam.      Plan:     See After Visit Summary for Counseling Recommendations  Keep up a regular exercise program and make sure you are eating a healthy diet Try to eat 4 servings of dairy a day, or if you are lactose intolerant take a calcium with vitamin D daily.  Your vaccines are up to date.   Make sure keep appointment in may for eye exam. We will call to get a copy of the previous report from MRI. Due for CMP and fasting lipid panel. Congratulated her on starting  an exercise routine. Hopefully her triglycerides to look better this time. Vaccines are all up to date. Colonoscopy is up-to-date. Pap smear is up-to-date.  Hypertension-uncontrolled today. Discussed continuing to work on weight loss and diet. In the meantime we'll add amlodipine 2.5 mg daily and see if this helps bring in her blood pressure. We discussed potential side effects of the medication. She exhibits is anything that is concerning her please call back sooner rather than later. Otherwise followup in 6-8 weeks to make sure she's  tolerating it well and hopefully she will have lost some weight.

## 2013-04-09 ENCOUNTER — Encounter: Payer: Self-pay | Admitting: Family Medicine

## 2013-04-10 ENCOUNTER — Ambulatory Visit: Payer: BC Managed Care – PPO | Admitting: Family Medicine

## 2013-04-22 ENCOUNTER — Other Ambulatory Visit: Payer: Self-pay | Admitting: *Deleted

## 2013-04-22 DIAGNOSIS — E785 Hyperlipidemia, unspecified: Secondary | ICD-10-CM

## 2013-04-22 MED ORDER — AMLODIPINE BESYLATE 2.5 MG PO TABS: 2.5000 mg | ORAL_TABLET | Freq: Every day | ORAL | Status: DC

## 2013-04-22 MED ORDER — SIMVASTATIN 20 MG PO TABS: 20.0000 mg | ORAL_TABLET | Freq: Every day | ORAL | Status: DC

## 2013-04-22 MED ORDER — SERTRALINE HCL 50 MG PO TABS: 50.0000 mg | ORAL_TABLET | Freq: Every day | ORAL | Status: DC

## 2013-04-22 MED ORDER — LOSARTAN POTASSIUM 100 MG PO TABS: 100.0000 mg | ORAL_TABLET | Freq: Every day | ORAL | Status: DC

## 2013-04-22 NOTE — Progress Notes (Signed)
90 day supply of meds.Rebecca Austin [place]

## 2013-04-24 LAB — LIPID PANEL
LDL Cholesterol: 82 mg/dL (ref 0–99)
Total CHOL/HDL Ratio: 4 Ratio
VLDL: 40 mg/dL (ref 0–40)

## 2013-04-24 LAB — COMPLETE METABOLIC PANEL WITH GFR
ALT: 16 U/L (ref 0–35)
AST: 19 U/L (ref 0–37)
Albumin: 4.3 g/dL (ref 3.5–5.2)
Alkaline Phosphatase: 68 U/L (ref 39–117)
Glucose, Bld: 100 mg/dL — ABNORMAL HIGH (ref 70–99)
Potassium: 3.9 mEq/L (ref 3.5–5.3)
Sodium: 141 mEq/L (ref 135–145)
Total Protein: 6.3 g/dL (ref 6.0–8.3)

## 2013-04-26 ENCOUNTER — Encounter: Payer: Self-pay | Admitting: Obstetrics & Gynecology

## 2013-04-30 ENCOUNTER — Other Ambulatory Visit: Payer: Self-pay | Admitting: *Deleted

## 2013-04-30 ENCOUNTER — Telehealth: Payer: Self-pay | Admitting: *Deleted

## 2013-04-30 DIAGNOSIS — Z780 Asymptomatic menopausal state: Secondary | ICD-10-CM

## 2013-04-30 MED ORDER — ESTRADIOL 1 MG PO TABS: 2.0000 mg | ORAL_TABLET | Freq: Every day | ORAL | Status: DC

## 2013-04-30 NOTE — Telephone Encounter (Signed)
Pt called stating that she was to be taking Estrace 1mg  BID.  This new RX was sent to her mail order pharmacy per Dr Marice Potter.

## 2013-04-30 NOTE — Telephone Encounter (Signed)
Pt called concerning that her Estrace should be 1 mg BID instead of 1/2 BID  This new RX was sent to [facility] per Dr Marice Potter.

## 2013-05-13 ENCOUNTER — Ambulatory Visit: Payer: BC Managed Care – PPO | Admitting: Family Medicine

## 2013-05-14 ENCOUNTER — Encounter: Payer: Self-pay | Admitting: Family Medicine

## 2013-05-14 ENCOUNTER — Ambulatory Visit (INDEPENDENT_AMBULATORY_CARE_PROVIDER_SITE_OTHER): Payer: BC Managed Care – PPO | Admitting: Family Medicine

## 2013-05-14 VITALS — BP 130/75 | HR 75 | Wt 170.0 lb

## 2013-05-14 DIAGNOSIS — J3489 Other specified disorders of nose and nasal sinuses: Secondary | ICD-10-CM

## 2013-05-14 DIAGNOSIS — R0981 Nasal congestion: Secondary | ICD-10-CM

## 2013-05-14 DIAGNOSIS — I10 Essential (primary) hypertension: Secondary | ICD-10-CM

## 2013-05-14 DIAGNOSIS — L659 Nonscarring hair loss, unspecified: Secondary | ICD-10-CM

## 2013-05-14 DIAGNOSIS — J309 Allergic rhinitis, unspecified: Secondary | ICD-10-CM

## 2013-05-14 MED ORDER — PREDNISONE 20 MG PO TABS: 40.0000 mg | ORAL_TABLET | Freq: Every day | ORAL | Status: DC

## 2013-05-14 NOTE — Patient Instructions (Signed)
You must stop the Affrin.  If you are not better in 1-2 weeks then call and I will refer you to an ENT specialist.

## 2013-05-14 NOTE — Progress Notes (Signed)
  Subjective:    Patient ID: Rebecca Austin, female    DOB: 15-Mar-1951, 62 y.o.   MRN: [id number]  HPI HTN -  Pt denies chest pain, SOB, dizziness, or heart palpitations.  Taking meds as directed w/o problems.  Denies medication side effects.    Sinusitis - Tried the fluticasone and says not relief. Has  Been using her Affrin for months and never stopped it.  Clear mucous. Says would get so congested at night. No fever. No facial pain or pressure.   Hair loss started slowly over the alst 2-3 years. Starting to get receding hairlone and hair loss imostly on the top. No family history.  Says get hot very easily.   Chronic constipation she did stop the stimulant laxatives and tried the MiraLax. She says it wasn't helpful in fact her bowel started to get back up so she started the stimulant laxatives again. She wants and if there's anything else that she can try.  Review of Systems     Objective:   Physical Exam  Constitutional: She is oriented to person, place, and time. She appears well-developed and well-nourished.  HENT:  Head: Normocephalic and atraumatic.  Right Ear: External ear normal.  Left Ear: External ear normal.  Nose: Nose normal.  Mouth/Throat: Oropharynx is clear and moist.  TMs and canals are clear. Turbinates are very swollen and pale  Eyes: Conjunctivae and EOM are normal. Pupils are equal, round, and reactive to light.  Neck: Neck supple. No thyromegaly present.  Cardiovascular: Normal rate, regular rhythm and normal heart sounds.   Pulmonary/Chest: Effort normal and breath sounds normal. She has no wheezes.  Lymphadenopathy:    She has no cervical adenopathy.  Neurological: She is alert and oriented to person, place, and time.  Skin: Skin is warm and dry.  She does have thinning of the hair on the crown of her head. She has loss of some hair follicles.  Psychiatric: She has a normal mood and affect.          Assessment & Plan:  HTN - Well controlled. F/U in  6 months.   Allergic rhinitis  - Will tx with oral steorids, per her request.. Restart the fluticasone.  Has to STOP the AFFRIN Immediately.  She is cleary hooked on it. We could consider adding a nasal antihistamine. She prefers to try oral steroids at least initially instead so that she can try to wean off of the Afrin. This is not typical but if she agrees to stop the Afrin then I will give her 5 days of prednisone. She says she's tolerated it well the past.Refer to ENT if not improving after stops the affrin.   Chronic constipation - I have encouraged her to stop over-the-counter laxative stimulants. She did try the MiraLax and felt like she started to get back up again. So she went back to using her stimulants. I would like her to try one of the newer prescription agents. Given samples of Linzess 145 mg to take daily. If helpful then please call me back and we can send prescription to your pharmacy.  Hair loss-will check her thyroid levels. If these are normal then we can consider treatment for alopecia areata

## 2013-05-15 ENCOUNTER — Encounter: Payer: Self-pay | Admitting: Family Medicine

## 2013-05-25 ENCOUNTER — Encounter: Payer: Self-pay | Admitting: Family Medicine

## 2013-05-27 ENCOUNTER — Other Ambulatory Visit: Payer: Self-pay | Admitting: *Deleted

## 2013-05-27 MED ORDER — QUETIAPINE FUMARATE 200 MG PO TABS: 200.0000 mg | ORAL_TABLET | Freq: Every day | ORAL | Status: DC

## 2013-05-30 ENCOUNTER — Encounter: Payer: Self-pay | Admitting: Family Medicine

## 2013-06-02 ENCOUNTER — Other Ambulatory Visit: Payer: Self-pay | Admitting: Family Medicine

## 2013-06-02 MED ORDER — LINACLOTIDE 145 MCG PO CAPS: 145.0000 ug | ORAL_CAPSULE | Freq: Every day | ORAL | Status: DC

## 2013-06-13 ENCOUNTER — Ambulatory Visit: Payer: BC Managed Care – PPO | Admitting: Family Medicine

## 2013-06-25 ENCOUNTER — Other Ambulatory Visit: Payer: Self-pay | Admitting: *Deleted

## 2013-06-25 DIAGNOSIS — E785 Hyperlipidemia, unspecified: Secondary | ICD-10-CM

## 2013-06-25 MED ORDER — SIMVASTATIN 20 MG PO TABS: 20.0000 mg | ORAL_TABLET | Freq: Every day | ORAL | Status: DC

## 2013-08-08 ENCOUNTER — Other Ambulatory Visit: Payer: Self-pay | Admitting: *Deleted

## 2013-08-08 MED ORDER — BUPROPION HCL ER (XL) 300 MG PO TB24: 300.0000 mg | ORAL_TABLET | ORAL | Status: DC

## 2013-09-03 ENCOUNTER — Other Ambulatory Visit: Payer: Self-pay | Admitting: *Deleted

## 2013-09-03 DIAGNOSIS — N951 Menopausal and female climacteric states: Secondary | ICD-10-CM

## 2013-09-03 MED ORDER — AMLODIPINE BESYLATE 2.5 MG PO TABS: 2.5000 mg | ORAL_TABLET | Freq: Every day | ORAL | Status: DC

## 2013-09-03 MED ORDER — MEDROXYPROGESTERONE ACETATE 2.5 MG PO TABS: 2.5000 mg | ORAL_TABLET | Freq: Every day | ORAL | Status: DC

## 2013-09-03 NOTE — Telephone Encounter (Signed)
E-fax sent from [facility] mail for RF authorization on Provera 2.5 mg # 90.  Authorization given per Dr Marice Potter.

## 2013-09-06 ENCOUNTER — Other Ambulatory Visit: Payer: Self-pay | Admitting: *Deleted

## 2013-09-06 MED ORDER — AMLODIPINE BESYLATE 2.5 MG PO TABS: 2.5000 mg | ORAL_TABLET | Freq: Every day | ORAL | Status: DC

## 2013-09-06 NOTE — Progress Notes (Signed)
Amlodipine sent to [facility]

## 2013-09-09 ENCOUNTER — Other Ambulatory Visit: Payer: Self-pay | Admitting: *Deleted

## 2013-09-09 ENCOUNTER — Other Ambulatory Visit: Payer: Self-pay | Admitting: Family Medicine

## 2013-09-09 DIAGNOSIS — E785 Hyperlipidemia, unspecified: Secondary | ICD-10-CM

## 2013-09-09 MED ORDER — SERTRALINE HCL 50 MG PO TABS: 50.0000 mg | ORAL_TABLET | Freq: Every day | ORAL | Status: DC

## 2013-09-09 MED ORDER — SIMVASTATIN 20 MG PO TABS: 20.0000 mg | ORAL_TABLET | Freq: Every day | ORAL | Status: DC

## 2013-09-11 ENCOUNTER — Telehealth: Payer: Self-pay | Admitting: *Deleted

## 2013-09-11 NOTE — Telephone Encounter (Signed)
Called pt to verify what dose of estradiol she is taking lvm for return call.Loralee Pacas [place]

## 2013-09-12 ENCOUNTER — Encounter: Payer: Self-pay | Admitting: Obstetrics & Gynecology

## 2013-09-16 ENCOUNTER — Other Ambulatory Visit: Payer: Self-pay | Admitting: *Deleted

## 2013-09-16 MED ORDER — CARISOPRODOL 350 MG PO TABS: 350.0000 mg | ORAL_TABLET | Freq: Three times a day (TID) | ORAL | Status: DC | PRN

## 2013-09-18 ENCOUNTER — Encounter: Payer: Self-pay | Admitting: Obstetrics & Gynecology

## 2013-09-19 ENCOUNTER — Other Ambulatory Visit: Payer: Self-pay | Admitting: Obstetrics & Gynecology

## 2013-09-19 ENCOUNTER — Ambulatory Visit (INDEPENDENT_AMBULATORY_CARE_PROVIDER_SITE_OTHER): Payer: BC Managed Care – PPO | Admitting: Family Medicine

## 2013-09-19 DIAGNOSIS — Z23 Encounter for immunization: Secondary | ICD-10-CM

## 2013-09-19 DIAGNOSIS — Z139 Encounter for screening, unspecified: Secondary | ICD-10-CM

## 2013-09-19 NOTE — Progress Notes (Signed)
  Subjective:    Patient ID: Rebecca Austin, female    DOB: [birth year], 62 y.o.   MRN: [id number] Flu shot given LD.  zostavax given right arm subcut.  No complications.  Donne Anon, CMA HPI    Review of Systems     Objective:   Physical Exam        Assessment & Plan:

## 2013-10-03 ENCOUNTER — Ambulatory Visit (INDEPENDENT_AMBULATORY_CARE_PROVIDER_SITE_OTHER): Payer: BC Managed Care – PPO

## 2013-10-03 DIAGNOSIS — Z139 Encounter for screening, unspecified: Secondary | ICD-10-CM

## 2013-10-03 DIAGNOSIS — Z1231 Encounter for screening mammogram for malignant neoplasm of breast: Secondary | ICD-10-CM

## 2013-10-31 ENCOUNTER — Other Ambulatory Visit: Payer: Self-pay | Admitting: *Deleted

## 2013-10-31 MED ORDER — OMEPRAZOLE 20 MG PO CPDR: 20.0000 mg | DELAYED_RELEASE_CAPSULE | Freq: Every day | ORAL | Status: DC

## 2013-11-01 ENCOUNTER — Other Ambulatory Visit: Payer: Self-pay | Admitting: Family Medicine

## 2013-11-02 ENCOUNTER — Other Ambulatory Visit: Payer: Self-pay | Admitting: Family Medicine

## 2013-11-20 ENCOUNTER — Other Ambulatory Visit: Payer: Self-pay | Admitting: Family Medicine

## 2013-11-21 ENCOUNTER — Other Ambulatory Visit: Payer: Self-pay | Admitting: *Deleted

## 2013-11-21 DIAGNOSIS — E785 Hyperlipidemia, unspecified: Secondary | ICD-10-CM

## 2013-11-21 MED ORDER — FLUOCINONIDE 0.05 % EX CREA: TOPICAL_CREAM | CUTANEOUS | Status: DC

## 2013-11-21 MED ORDER — SIMVASTATIN 20 MG PO TABS: 20.0000 mg | ORAL_TABLET | Freq: Every day | ORAL | Status: DC

## 2013-11-21 MED ORDER — BUPROPION HCL ER (XL) 300 MG PO TB24: 300.0000 mg | ORAL_TABLET | ORAL | Status: DC

## 2013-12-17 ENCOUNTER — Ambulatory Visit (INDEPENDENT_AMBULATORY_CARE_PROVIDER_SITE_OTHER): Payer: BC Managed Care – PPO | Admitting: Family Medicine

## 2013-12-17 ENCOUNTER — Encounter: Payer: Self-pay | Admitting: Family Medicine

## 2013-12-17 VITALS — BP 120/71 | HR 77 | Wt 166.0 lb

## 2013-12-17 DIAGNOSIS — R7301 Impaired fasting glucose: Secondary | ICD-10-CM

## 2013-12-17 DIAGNOSIS — G4700 Insomnia, unspecified: Secondary | ICD-10-CM

## 2013-12-17 DIAGNOSIS — F339 Major depressive disorder, recurrent, unspecified: Secondary | ICD-10-CM

## 2013-12-17 DIAGNOSIS — E785 Hyperlipidemia, unspecified: Secondary | ICD-10-CM

## 2013-12-17 DIAGNOSIS — I10 Essential (primary) hypertension: Secondary | ICD-10-CM

## 2013-12-17 LAB — POCT GLYCOSYLATED HEMOGLOBIN (HGB A1C): HEMOGLOBIN A1C: 5.8

## 2013-12-17 NOTE — Patient Instructions (Signed)
Decrease the sertraline to half a tab daily for 2 weeks, then half tab every other day for 2 days.

## 2013-12-17 NOTE — Progress Notes (Signed)
   Subjective:    Patient ID: Rebecca Austin, female    DOB: Jul 17, 1951, 63 y.o.   MRN: [id number]  HPI Hypertension- Pt denies chest pain, SOB, dizziness, or heart palpitations.  Taking meds as directed w/o problems.  Denies medication side effects.    Depression-only complaint is sleep issues. She is otherwise doing well. She's tolerating her medications without any side effects. She is interested in starting to wean them.  Hyperlipidemia-tolerating the zocor well w/o SE.   Lab Results  Component Value Date   CHOL 163 04/23/2013   HDL 41 04/23/2013   LDLCALC 82 04/23/2013   TRIG 199* 04/23/2013   CHOLHDL 4.0 04/23/2013   IFG - No inc thirst or urination.   Lab Results  Component Value Date   HGBA1C 5.8 12/17/2013   Inosmnia - seroquel working well. We switch her from extending at least to the regular release because of cost issues. And she says the back she likes it better. She takes it and helps her get a great nights rest. Review of Systems     Objective:   Physical Exam  Constitutional: She is oriented to person, place, and time. She appears well-developed and well-nourished.  HENT:  Head: Normocephalic and atraumatic.  Cardiovascular: Normal rate, regular rhythm and normal heart sounds.   Pulmonary/Chest: Effort normal and breath sounds normal.  Neurological: She is alert and oriented to person, place, and time.  Skin: Skin is warm and dry.  Psychiatric: She has a normal mood and affect. Her behavior is normal.          Assessment & Plan:  Hypertension-well-controlled. Continue current regimen. Followup in 6 months.  Keep up regular exercise.    Depression-well controlled. PHQ 9 score of 1 today. Discussed option of weaning 1 her medications. We will start by weaning the sertraline and keep the Wellbutrin for now. Taper right now. Please call if any problems. Otherwise followup in 6 months.  Insomnia-currently is responding really well to Seroquel. Continue current  regimen.  IFG - well controlled. Followup in 6 months.  A1c is 5.8.  Hyperlipidemia-continue statin. We'll repeat lipids at followup visit in 6 months.

## 2014-01-13 ENCOUNTER — Other Ambulatory Visit: Payer: Self-pay | Admitting: Family Medicine

## 2014-01-17 ENCOUNTER — Other Ambulatory Visit: Payer: Self-pay | Admitting: Family Medicine

## 2014-01-20 ENCOUNTER — Other Ambulatory Visit: Payer: Self-pay | Admitting: Family Medicine

## 2014-01-27 ENCOUNTER — Other Ambulatory Visit: Payer: Self-pay | Admitting: Family Medicine

## 2014-02-13 ENCOUNTER — Encounter: Payer: Self-pay | Admitting: Family Medicine

## 2014-02-14 ENCOUNTER — Other Ambulatory Visit: Payer: Self-pay | Admitting: Family Medicine

## 2014-02-14 MED ORDER — BUPROPION HCL ER (XL) 150 MG PO TB24: ORAL_TABLET | ORAL | Status: DC

## 2014-03-07 ENCOUNTER — Other Ambulatory Visit: Payer: Self-pay | Admitting: Family Medicine

## 2014-03-11 ENCOUNTER — Other Ambulatory Visit: Payer: Self-pay | Admitting: Family Medicine

## 2014-03-26 ENCOUNTER — Ambulatory Visit: Payer: BC Managed Care – PPO | Admitting: Obstetrics & Gynecology

## 2014-04-08 ENCOUNTER — Other Ambulatory Visit: Payer: Self-pay | Admitting: Family Medicine

## 2014-04-08 ENCOUNTER — Other Ambulatory Visit: Payer: Self-pay | Admitting: *Deleted

## 2014-04-08 ENCOUNTER — Other Ambulatory Visit: Payer: Self-pay | Admitting: Obstetrics & Gynecology

## 2014-04-10 ENCOUNTER — Encounter: Payer: Self-pay | Admitting: Obstetrics & Gynecology

## 2014-04-10 ENCOUNTER — Ambulatory Visit (INDEPENDENT_AMBULATORY_CARE_PROVIDER_SITE_OTHER): Payer: BC Managed Care – PPO | Admitting: Obstetrics & Gynecology

## 2014-04-10 VITALS — BP 128/80 | HR 68 | Resp 16 | Ht 62.0 in | Wt 165.0 lb

## 2014-04-10 DIAGNOSIS — Z0000 Encounter for general adult medical examination without abnormal findings: Secondary | ICD-10-CM

## 2014-04-10 DIAGNOSIS — Z01419 Encounter for gynecological examination (general) (routine) without abnormal findings: Secondary | ICD-10-CM

## 2014-04-10 DIAGNOSIS — N951 Menopausal and female climacteric states: Secondary | ICD-10-CM

## 2014-04-10 MED ORDER — ESTRADIOL 1 MG PO TABS: 1.0000 mg | ORAL_TABLET | Freq: Two times a day (BID) | ORAL | Status: DC

## 2014-04-10 MED ORDER — MEDROXYPROGESTERONE ACETATE 2.5 MG PO TABS: 2.5000 mg | ORAL_TABLET | Freq: Every day | ORAL | Status: DC

## 2014-04-10 NOTE — Progress Notes (Signed)
Subjective:    Rebecca Austin is a 63 y.o. female who presents for an annual exam. The patient has no complaints today. She needs refills of her HRT. She tried to decrease the estrogen but ran into too many hot flashes. The patient is not currently sexually active secondary to ED. GYN screening history: last pap: was normal with negative HPV. The patient wears seatbelts: yes. The patient participates in regular exercise: yes. (8 hours per week) Has the patient ever been transfused or tattooed?: no. The patient reports that there is not domestic violence in her life.   Menstrual History: OB History   Grav Para Term Preterm Abortions TAB SAB Ect Mult Living                  Menarche age: [age]  No LMP recorded. Patient is postmenopausal.    The following portions of the patient's history were reviewed and updated as appropriate: allergies, current medications, past family history, past medical history, past social history, past surgical history and problem list.  Review of Systems A comprehensive review of systems was negative. Married for 35 years. Previously works for [employer].   Objective:    BP 128/80  Pulse 68  Resp 16  Ht 5\' 2"  (1.575 m)  Wt 165 lb (74.844 kg)  BMI 30.17 kg/m2  General Appearance:    Alert, cooperative, no distress, appears stated age  Head:    Normocephalic, without obvious abnormality, atraumatic  Eyes:    PERRL, conjunctiva/corneas clear, EOM's intact, fundi    benign, both eyes  Ears:    Normal TM's and external ear canals, both ears  Nose:   Nares normal, septum midline, mucosa normal, no drainage    or sinus tenderness  Throat:   Lips, mucosa, and tongue normal; teeth and gums normal  Neck:   Supple, symmetrical, trachea midline, no adenopathy;    thyroid:  no enlargement/tenderness/nodules; no carotid   bruit or JVD  Back:     Symmetric, no curvature, ROM normal, no CVA tenderness  Lungs:     Clear to auscultation bilaterally, respirations unlabored   Chest Wall:    No tenderness or deformity   Heart:    Regular rate and rhythm, S1 and S2 normal, no murmur, rub   or gallop  Breast Exam:    No tenderness, masses, or nipple abnormality  Abdomen:     Soft, non-tender, bowel sounds active all four quadrants,    no masses, no organomegaly  Genitalia:    Normal female without lesion, discharge or tenderness, NSSA, NT, mobile, adnexa not palpable     Extremities:   Extremities normal, atraumatic, no cyanosis or edema  Pulses:   2+ and symmetric all extremities  Skin:   Skin color, texture, turgor normal, no rashes or lesions  Lymph nodes:   Cervical, supraclavicular, and axillary nodes normal  Neurologic:   CNII-XII intact, normal strength, sensation and reflexes    throughout  .    Assessment:    Healthy female exam.    Plan:     Breast self exam technique reviewed and patient encouraged to perform self-exam monthly. Mammogram. annually

## 2014-05-08 ENCOUNTER — Other Ambulatory Visit: Payer: Self-pay | Admitting: Family Medicine

## 2014-06-18 ENCOUNTER — Encounter: Payer: Self-pay | Admitting: Family Medicine

## 2014-06-25 ENCOUNTER — Other Ambulatory Visit: Payer: Self-pay | Admitting: *Deleted

## 2014-06-25 DIAGNOSIS — Z0000 Encounter for general adult medical examination without abnormal findings: Secondary | ICD-10-CM

## 2014-06-25 DIAGNOSIS — R7301 Impaired fasting glucose: Secondary | ICD-10-CM

## 2014-06-30 LAB — COMPLETE METABOLIC PANEL WITH GFR
ALT: 12 U/L (ref 0–35)
AST: 15 U/L (ref 0–37)
Albumin: 3.9 g/dL (ref 3.5–5.2)
Alkaline Phosphatase: 58 U/L (ref 39–117)
BILIRUBIN TOTAL: 0.6 mg/dL (ref 0.2–1.2)
BUN: 15 mg/dL (ref 6–23)
CO2: 27 meq/L (ref 19–32)
CREATININE: 0.88 mg/dL (ref 0.50–1.10)
Calcium: 9.2 mg/dL (ref 8.4–10.5)
Chloride: 106 mEq/L (ref 96–112)
GFR, EST NON AFRICAN AMERICAN: 72 mL/min
GFR, Est African American: 83 mL/min
GLUCOSE: 100 mg/dL — AB (ref 70–99)
Potassium: 3.9 mEq/L (ref 3.5–5.3)
Sodium: 139 mEq/L (ref 135–145)
TOTAL PROTEIN: 6.4 g/dL (ref 6.0–8.3)

## 2014-06-30 LAB — TSH: TSH: 4.748 u[IU]/mL — AB (ref 0.350–4.500)

## 2014-06-30 LAB — LIPID PANEL
CHOLESTEROL: 130 mg/dL (ref 0–200)
HDL: 43 mg/dL (ref >39)
LDL Cholesterol: 63 mg/dL (ref 0–99)
Total CHOL/HDL Ratio: 3 Ratio
Triglycerides: 122 mg/dL (ref <150)
VLDL: 24 mg/dL (ref 0–40)

## 2014-06-30 LAB — HEMOGLOBIN A1C
Hgb A1c MFr Bld: 5.8 % — ABNORMAL HIGH (ref <5.7)
MEAN PLASMA GLUCOSE: 120 mg/dL — AB (ref <117)

## 2014-07-01 ENCOUNTER — Encounter: Payer: Self-pay | Admitting: Family Medicine

## 2014-07-01 ENCOUNTER — Ambulatory Visit (INDEPENDENT_AMBULATORY_CARE_PROVIDER_SITE_OTHER): Payer: BC Managed Care – PPO | Admitting: Family Medicine

## 2014-07-01 VITALS — BP 121/69 | HR 72 | Wt 164.0 lb

## 2014-07-01 DIAGNOSIS — Z0000 Encounter for general adult medical examination without abnormal findings: Secondary | ICD-10-CM

## 2014-07-01 DIAGNOSIS — R946 Abnormal results of thyroid function studies: Secondary | ICD-10-CM

## 2014-07-01 DIAGNOSIS — R7989 Other specified abnormal findings of blood chemistry: Secondary | ICD-10-CM

## 2014-07-01 NOTE — Patient Instructions (Addendum)
Keep up a regular exercise program and make sure you are eating a healthy diet Try to eat 4 servings of dairy a day, or if you are lactose intolerant take a calcium with vitamin D daily.  Your vaccines are up to date.    Go to the lab about 3 or 4 weeks to recheck her thyroid hormones.

## 2014-07-01 NOTE — Progress Notes (Signed)
  Subjective:     Rebecca Austin is a 63 y.o. female and is here for a comprehensive physical exam. The patient reports problems - see below comments.  History   Social History  . Marital Status: Married    Spouse Name: N/A    Number of Children: 1  . Years of Education: N/A   Occupational History  . 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 Smoking status: Former Smoker    Types: Cigarettes    Quit date: 12/12/2009  . Smokeless tobacco: Never Used  . Alcohol Use: 0.6 oz/week    1 Glasses of wine per week  . Drug Use: No  . Sexual Activity: Yes    Partners: Male    Birth Control/ Protection: Post-menopausal     Comment: 2nd marriage,  61 yr old son in prison in [place] and gets out 07, walks daily.   Other Topics Concern  . Not on file   Social History Narrative   Working out regulary 3 times per week.    Health Maintenance  Topic Date Due  . Influenza Vaccine  07/12/2014  . Mammogram  10/04/2015  . Pap Smear  03/19/2016  . Colonoscopy  12/12/2020  . Tetanus/tdap  09/19/2021  . Zostavax  Completed    The following portions of the patient's history were reviewed and updated as appropriate: allergies, current medications, past family history, past medical history, past social history, past surgical history and problem list.  Review of Systems A comprehensive review of systems was negative.   Objective:    BP 121/69  Pulse 72  Wt 164 lb (74.39 kg) General appearance: alert, cooperative and appears stated age Head: Normocephalic, without obvious abnormality, atraumatic Eyes: conje clear, EOMI, PEERLA Ears: normal TM's and external ear canals both ears Nose: Nares normal. Septum midline. Mucosa normal. No drainage or sinus tenderness. Throat: lips, mucosa, and tongue normal; teeth and gums normal Neck: no adenopathy, no carotid bruit, no JVD, supple, symmetrical, trachea midline and thyroid not enlarged, symmetric, no  tenderness/mass/nodules Back: symmetric, no curvature. ROM normal. No CVA tenderness. Lungs: clear to auscultation bilaterally Heart: regular rate and rhythm, S1, S2 normal, no murmur, click, rub or gallop Abdomen: soft, non-tender; bowel sounds normal; no masses,  no organomegaly Extremities: extremities normal, atraumatic, no cyanosis or edema Pulses: 2+ and symmetric Skin: Skin color, texture, turgor normal. No rashes or lesions Lymph nodes: Cervical, supraclavicular, and axillary nodes normal. Neurologic: Alert and oriented X 3, normal strength and tone. Normal symmetric reflexes. Normal coordination and gait    Assessment:    Healthy female exam.      Plan:     See After Visit Summary for Counseling Recommendations  Keep up a regular exercise program and make sure you are eating a healthy diet Try to eat 4 servings of dairy a day, or if you are lactose intolerant take a calcium with vitamin D daily.  Your vaccines are up to date.   HTN - well controlled. F/U in 6 months.    Abnoraml thyroid-  Has had some contipation, hair loss, and fatigue. Will recheck levels at the end of the month.

## 2014-07-05 ENCOUNTER — Other Ambulatory Visit: Payer: Self-pay | Admitting: Family Medicine

## 2014-07-07 ENCOUNTER — Encounter: Payer: Self-pay | Admitting: *Deleted

## 2014-07-07 ENCOUNTER — Other Ambulatory Visit: Payer: Self-pay | Admitting: *Deleted

## 2014-07-07 MED ORDER — SIMVASTATIN 20 MG PO TABS: ORAL_TABLET | ORAL | Status: DC

## 2014-07-07 MED ORDER — AMLODIPINE BESYLATE 2.5 MG PO TABS: ORAL_TABLET | ORAL | Status: DC

## 2014-07-28 ENCOUNTER — Other Ambulatory Visit: Payer: Self-pay | Admitting: Family Medicine

## 2014-07-29 ENCOUNTER — Encounter: Payer: Self-pay | Admitting: Family Medicine

## 2014-07-29 ENCOUNTER — Other Ambulatory Visit: Payer: Self-pay | Admitting: Family Medicine

## 2014-07-29 LAB — T3, FREE: T3, Free: 2.6 pg/mL (ref 2.3–4.2)

## 2014-07-29 LAB — TSH: TSH: 4.526 u[IU]/mL — ABNORMAL HIGH (ref 0.350–4.500)

## 2014-07-29 LAB — T4, FREE: Free T4: 1.02 ng/dL (ref 0.80–1.80)

## 2014-07-29 MED ORDER — LEVOTHYROXINE SODIUM 25 MCG PO CAPS: 25.0000 ug | ORAL_CAPSULE | Freq: Every day | ORAL | Status: DC

## 2014-08-04 ENCOUNTER — Other Ambulatory Visit: Payer: Self-pay | Admitting: Obstetrics & Gynecology

## 2014-08-06 ENCOUNTER — Other Ambulatory Visit: Payer: Self-pay | Admitting: *Deleted

## 2014-08-06 DIAGNOSIS — N951 Menopausal and female climacteric states: Secondary | ICD-10-CM

## 2014-08-06 MED ORDER — ESTRADIOL 1 MG PO TABS: 1.0000 mg | ORAL_TABLET | Freq: Two times a day (BID) | ORAL | Status: DC

## 2014-08-06 NOTE — Telephone Encounter (Signed)
RF authorization sent to [facility] for Estradiol 1 mg per Dr Marice Potter.  Last appt with Dr Marice Potter 4/15.

## 2014-09-04 ENCOUNTER — Other Ambulatory Visit: Payer: Self-pay | Admitting: Obstetrics & Gynecology

## 2014-09-04 ENCOUNTER — Encounter: Payer: Self-pay | Admitting: Family Medicine

## 2014-09-04 ENCOUNTER — Ambulatory Visit (INDEPENDENT_AMBULATORY_CARE_PROVIDER_SITE_OTHER): Payer: BC Managed Care – PPO | Admitting: Family Medicine

## 2014-09-04 VITALS — BP 126/68 | HR 62 | Temp 98.5°F | Ht 62.0 in | Wt 167.0 lb

## 2014-09-04 DIAGNOSIS — R635 Abnormal weight gain: Secondary | ICD-10-CM

## 2014-09-04 DIAGNOSIS — E039 Hypothyroidism, unspecified: Secondary | ICD-10-CM

## 2014-09-04 DIAGNOSIS — Z1231 Encounter for screening mammogram for malignant neoplasm of breast: Secondary | ICD-10-CM

## 2014-09-04 MED ORDER — CARISOPRODOL 350 MG PO TABS: 350.0000 mg | ORAL_TABLET | Freq: Three times a day (TID) | ORAL | Status: DC | PRN

## 2014-09-04 NOTE — Patient Instructions (Signed)
My [employer]itness Pal is a Chief Financial Officer that is free to help you track calories.

## 2014-09-04 NOTE — Progress Notes (Signed)
   Subjective:    Patient ID: Rebecca Austin, female    DOB: [birth year], 63 y.o.   MRN: [id number]  HPI Hypothyroid - She has been taking the medications but she is still gaining weight. She has been working out.  She was hoping the thyroid medication would help some of her sxs.  Does have dry skin, constipation an some hairloss.    Abnormal weight gain-that she's actually gained 3 pounds and she was last here in July. She has been eating very healthy. She does not sure exactly how many calories a day she is eating. She has been getting some exercise.  Review of Systems     Objective:   Physical Exam  Constitutional: She is oriented to person, place, and time. She appears well-developed and well-nourished.  HENT:  Head: Normocephalic and atraumatic.  Neck: Neck supple. No thyromegaly present.  Cardiovascular: Normal rate, regular rhythm and normal heart sounds.   Lymphadenopathy:    She has cervical adenopathy.  Neurological: She is alert and oriented to person, place, and time.  Skin: Skin is warm and dry.  Psychiatric: She has a normal mood and affect. Her behavior is normal.          Assessment & Plan:  Subclinical Hypothyroid - we discussed different options. Recommend recheck TSH. If her levels a little bit elevated then we might be able to adjust her dose and see if she starts to feel better. If her TSH is at goal somewhere between 1 and 2 then we might consider just discontinue medications and she really hasn't noticed any symptomatic improvement and technically she subclinical hyperthyroid. At that point we can then just recheck her level at her followup in 4 months.  Abnormal weight gain-recommended the Smart phone application my fitness PAL to help her keep track of gout calories and to set some weight loss goals. It sounds like she started doing a good job with her dietary choices it may just be the amount of calories she is taking in a daily basis. Continue with regular  exercise to help boost metabolism.

## 2014-09-05 LAB — TSH: TSH: 2.809 u[IU]/mL (ref 0.350–4.500)

## 2014-09-05 LAB — T4, FREE: FREE T4: 0.85 ng/dL (ref 0.80–1.80)

## 2014-09-08 ENCOUNTER — Other Ambulatory Visit: Payer: Self-pay | Admitting: Family Medicine

## 2014-09-11 ENCOUNTER — Encounter: Payer: Self-pay | Admitting: Family Medicine

## 2014-09-15 ENCOUNTER — Other Ambulatory Visit: Payer: Self-pay | Admitting: Family Medicine

## 2014-09-17 ENCOUNTER — Other Ambulatory Visit: Payer: Self-pay | Admitting: *Deleted

## 2014-09-17 MED ORDER — LEVOTHYROXINE SODIUM 25 MCG PO CAPS: 25.0000 ug | ORAL_CAPSULE | Freq: Every day | ORAL | Status: DC

## 2014-09-23 ENCOUNTER — Other Ambulatory Visit: Payer: Self-pay | Admitting: Family Medicine

## 2014-09-27 ENCOUNTER — Other Ambulatory Visit: Payer: Self-pay | Admitting: Family Medicine

## 2014-09-29 ENCOUNTER — Other Ambulatory Visit: Payer: Self-pay | Admitting: Family Medicine

## 2014-10-06 ENCOUNTER — Other Ambulatory Visit: Payer: Self-pay | Admitting: Family Medicine

## 2014-10-09 ENCOUNTER — Ambulatory Visit (INDEPENDENT_AMBULATORY_CARE_PROVIDER_SITE_OTHER): Payer: BC Managed Care – PPO

## 2014-10-09 DIAGNOSIS — Z1231 Encounter for screening mammogram for malignant neoplasm of breast: Secondary | ICD-10-CM | POA: Diagnosis not present

## 2014-10-13 ENCOUNTER — Other Ambulatory Visit: Payer: Self-pay | Admitting: Family Medicine

## 2014-10-25 ENCOUNTER — Other Ambulatory Visit: Payer: Self-pay | Admitting: Family Medicine

## 2014-12-06 ENCOUNTER — Other Ambulatory Visit: Payer: Self-pay | Admitting: Family Medicine

## 2014-12-18 ENCOUNTER — Ambulatory Visit (INDEPENDENT_AMBULATORY_CARE_PROVIDER_SITE_OTHER): Payer: BLUE CROSS/BLUE SHIELD | Admitting: Family Medicine

## 2014-12-18 ENCOUNTER — Encounter: Payer: Self-pay | Admitting: Family Medicine

## 2014-12-18 VITALS — BP 111/64 | HR 69 | Ht 62.0 in | Wt 146.0 lb

## 2014-12-18 DIAGNOSIS — R7301 Impaired fasting glucose: Secondary | ICD-10-CM

## 2014-12-18 DIAGNOSIS — I10 Essential (primary) hypertension: Secondary | ICD-10-CM

## 2014-12-18 DIAGNOSIS — R7989 Other specified abnormal findings of blood chemistry: Secondary | ICD-10-CM

## 2014-12-18 LAB — POCT GLYCOSYLATED HEMOGLOBIN (HGB A1C): Hemoglobin A1C: 5.5

## 2014-12-18 NOTE — Progress Notes (Signed)
   Subjective:    Patient ID: Rebecca HamburgerLaurie A Dupas, female    DOB: 1951-05-17, 64 y.o.   MRN: [id number]  HPI Subclinical hypothyroid - she decided to take her thyroid. She has been feeling well on it.   No Neg S.E. No palpitations.   Has about 20 lbs.  Has been using MyFitness pal and has been  Using her  Fit BIT.  Going to the gym 3 days per week. 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 She would like to weigh 125 with ultimate goal of 115.  She has not been sleeping well. She is only getting 6-7 hours. She drinks a lot and gets up to pee at night about 3 times.    IFG - no inc thrist or urination.   Review of Systems     Objective:   Physical Exam  Constitutional: She is oriented to person, place, and time. She appears well-developed and well-nourished.  HENT:  Head: Normocephalic and atraumatic.  Cardiovascular: Normal rate, regular rhythm and normal heart sounds.   Pulmonary/Chest: Effort normal and breath sounds normal.  Neurological: She is alert and oriented to person, place, and time.  Skin: Skin is warm and dry.  Psychiatric: She has a normal mood and affect. Her behavior is normal.          Assessment & Plan:  Subclinical hypothyroidism-she decided to continue with the medication for now. We'll recheck her TSH at the end of the month to make sure that stable especially with her recent weight loss.  Weight loss attempt-she is on a fantastic job and really creating a healthy lifestyle for herself and has lost 20 pounds. It back about a year and a half to 2 years ago she's actually lost a total of 40 pounds. Encouraged her to keep up the good work and try to reach her goals. Next  Hypertension-blood pressure looks fantastic today. She continues to lose weight encourage her to an eye on this. If blood pressure start to get low or she starts getting lightheaded or dizzy and we may need to decrease her losartan. Encouraged her to call the office if this happens. Follow-up in 6  months.  IFG - A1C back into the normal range today.

## 2014-12-30 ENCOUNTER — Ambulatory Visit: Payer: BC Managed Care – PPO | Admitting: Family Medicine

## 2015-01-08 ENCOUNTER — Other Ambulatory Visit: Payer: Self-pay | Admitting: Family Medicine

## 2015-01-16 LAB — TSH: TSH: 2.839 u[IU]/mL (ref 0.350–4.500)

## 2015-01-18 ENCOUNTER — Encounter: Payer: Self-pay | Admitting: Family Medicine

## 2015-01-18 MED ORDER — LINACLOTIDE 145 MCG PO CAPS: 145.0000 ug | ORAL_CAPSULE | Freq: Every day | ORAL | Status: DC

## 2015-01-25 ENCOUNTER — Other Ambulatory Visit: Payer: Self-pay | Admitting: Family Medicine

## 2015-01-28 ENCOUNTER — Other Ambulatory Visit: Payer: Self-pay | Admitting: Family Medicine

## 2015-01-30 ENCOUNTER — Other Ambulatory Visit: Payer: Self-pay

## 2015-01-30 MED ORDER — OMEPRAZOLE 20 MG PO CPDR: DELAYED_RELEASE_CAPSULE | ORAL | Status: DC

## 2015-02-04 ENCOUNTER — Other Ambulatory Visit: Payer: Self-pay

## 2015-02-04 MED ORDER — LEVOTHYROXINE SODIUM 25 MCG PO CAPS: 25.0000 ug | ORAL_CAPSULE | Freq: Every day | ORAL | Status: DC

## 2015-02-17 ENCOUNTER — Encounter: Payer: Self-pay | Admitting: Family Medicine

## 2015-02-20 ENCOUNTER — Encounter: Payer: Self-pay | Admitting: Family Medicine

## 2015-02-25 ENCOUNTER — Other Ambulatory Visit: Payer: Self-pay | Admitting: Family Medicine

## 2015-03-02 ENCOUNTER — Telehealth: Payer: Self-pay | Admitting: *Deleted

## 2015-03-02 NOTE — Telephone Encounter (Signed)
Pt left vm stating that the linzess is just too expensive and wanted to know if there was something generic that can be sent in.  Please advise.

## 2015-03-02 NOTE — Telephone Encounter (Signed)
Has she tried to coupon card? I think makes it $30 per month.  The other option is amitiza but it is not generic either but it may have a coupon card as well.

## 2015-03-03 NOTE — Telephone Encounter (Signed)
LMOM for pt to return call. 

## 2015-03-05 ENCOUNTER — Other Ambulatory Visit: Payer: Self-pay | Admitting: Family Medicine

## 2015-03-05 MED ORDER — LEVOTHYROXINE SODIUM 25 MCG PO CAPS: 25.0000 ug | ORAL_CAPSULE | Freq: Every day | ORAL | Status: DC

## 2015-03-16 ENCOUNTER — Other Ambulatory Visit: Payer: Self-pay | Admitting: Family Medicine

## 2015-04-06 ENCOUNTER — Other Ambulatory Visit: Payer: Self-pay | Admitting: Family Medicine

## 2015-04-16 ENCOUNTER — Encounter: Payer: Self-pay | Admitting: Obstetrics & Gynecology

## 2015-04-16 ENCOUNTER — Ambulatory Visit (INDEPENDENT_AMBULATORY_CARE_PROVIDER_SITE_OTHER): Payer: BLUE CROSS/BLUE SHIELD | Admitting: Obstetrics & Gynecology

## 2015-04-16 VITALS — BP 126/72 | HR 68 | Resp 16 | Ht 62.0 in | Wt 133.0 lb

## 2015-04-16 DIAGNOSIS — Z1151 Encounter for screening for human papillomavirus (HPV): Secondary | ICD-10-CM | POA: Diagnosis not present

## 2015-04-16 DIAGNOSIS — N949 Unspecified condition associated with female genital organs and menstrual cycle: Secondary | ICD-10-CM | POA: Diagnosis not present

## 2015-04-16 DIAGNOSIS — N9489 Other specified conditions associated with female genital organs and menstrual cycle: Secondary | ICD-10-CM

## 2015-04-16 DIAGNOSIS — Z01419 Encounter for gynecological examination (general) (routine) without abnormal findings: Secondary | ICD-10-CM

## 2015-04-16 DIAGNOSIS — Z124 Encounter for screening for malignant neoplasm of cervix: Secondary | ICD-10-CM | POA: Diagnosis not present

## 2015-04-16 DIAGNOSIS — Z0000 Encounter for general adult medical examination without abnormal findings: Secondary | ICD-10-CM

## 2015-04-16 NOTE — Progress Notes (Signed)
Subjective:    Rebecca Austin is a 64 y.o. MW female who presents for an annual exam. The patient has no complaints today. The patient is not currently sexually active. GYN screening history: last pap: was normal. The patient wears seatbelts: yes. The patient participates in regular exercise: yes. Has the patient ever been transfused or tattooed?: no. The patient reports that there is not domestic violence in her life.   Menstrual History: OB History    No data available      Menarche age: 56  No LMP recorded. Patient is postmenopausal.    The following portions of the patient's history were reviewed and updated as appropriate: allergies, current medications, past family history, past medical history, past social history, past surgical history and problem list.  Review of Systems Pertinent items are noted in HPI.  Married for 35 1/2 years. Retired from [employer] (LOVES IT) mammogram done 10/15. Colonoscopy negative about 2 years ago (due in 8 years). Her sister has a negative BRCA test. Objective:    BP 126/72 mmHg  Pulse 68  Resp 16  Ht 5' 2"  (1.575 m)  Wt 133 lb (60.328 kg)  BMI 24.32 kg/m2  General Appearance:    Alert, cooperative, no distress, appears stated age  Head:    Normocephalic, without obvious abnormality, atraumatic  Eyes:    PERRL, conjunctiva/corneas clear, EOM's intact, fundi    benign, both eyes  Ears:    Normal TM's and external ear canals, both ears  Nose:   Nares normal, septum midline, mucosa normal, no drainage    or sinus tenderness  Throat:   Lips, mucosa, and tongue normal; teeth and gums normal  Neck:   Supple, symmetrical, trachea midline, no adenopathy;    thyroid:  no enlargement/tenderness/nodules; no carotid   bruit or JVD  Back:     Symmetric, no curvature, ROM normal, no CVA tenderness  Lungs:     Clear to auscultation bilaterally, respirations unlabored  Chest Wall:    No tenderness or deformity   Heart:    Regular rate and rhythm, S1 and S2  normal, no murmur, rub   or gallop  Breast Exam:    No tenderness, masses, or nipple abnormality  Abdomen:     Soft, non-tender, bowel sounds active all four quadrants,    no masses, no organomegaly  Genitalia:    Normal female without lesion, discharge or tenderness     Extremities:   Extremities normal, atraumatic, no cyanosis or edema  Pulses:   2+ and symmetric all extremities  Skin:   Skin color, texture, turgor normal, no rashes or lesions  Lymph nodes:   Cervical, supraclavicular, and axillary nodes normal  Neurologic:   CNII-XII intact, normal strength, sensation and reflexes    throughout  .    Assessment:    Healthy female exam.    Plan:     Breast self exam technique reviewed and patient encouraged to perform self-exam monthly.   Thin prep pap with cotesting

## 2015-04-17 ENCOUNTER — Telehealth: Payer: Self-pay | Admitting: *Deleted

## 2015-04-17 LAB — VITAMIN D 25 HYDROXY (VIT D DEFICIENCY, FRACTURES): VIT D 25 HYDROXY: 56 ng/mL (ref 30–100)

## 2015-04-17 NOTE — Telephone Encounter (Signed)
Pt notified of normal Vitamin D Level

## 2015-04-20 LAB — CYTOLOGY - PAP

## 2015-04-27 ENCOUNTER — Other Ambulatory Visit: Payer: Self-pay | Admitting: Obstetrics & Gynecology

## 2015-04-29 ENCOUNTER — Ambulatory Visit (INDEPENDENT_AMBULATORY_CARE_PROVIDER_SITE_OTHER): Payer: BLUE CROSS/BLUE SHIELD

## 2015-04-29 DIAGNOSIS — N949 Unspecified condition associated with female genital organs and menstrual cycle: Secondary | ICD-10-CM | POA: Diagnosis not present

## 2015-04-29 DIAGNOSIS — Z0000 Encounter for general adult medical examination without abnormal findings: Secondary | ICD-10-CM

## 2015-04-29 DIAGNOSIS — M859 Disorder of bone density and structure, unspecified: Secondary | ICD-10-CM | POA: Diagnosis not present

## 2015-04-29 DIAGNOSIS — N9489 Other specified conditions associated with female genital organs and menstrual cycle: Secondary | ICD-10-CM

## 2015-05-12 ENCOUNTER — Encounter: Payer: Self-pay | Admitting: Obstetrics & Gynecology

## 2015-06-01 ENCOUNTER — Other Ambulatory Visit: Payer: Self-pay | Admitting: Family Medicine

## 2015-06-03 ENCOUNTER — Other Ambulatory Visit: Payer: Self-pay | Admitting: Family Medicine

## 2015-06-03 ENCOUNTER — Other Ambulatory Visit: Payer: Self-pay | Admitting: Obstetrics & Gynecology

## 2015-06-16 ENCOUNTER — Other Ambulatory Visit: Payer: Self-pay | Admitting: Family Medicine

## 2015-06-29 ENCOUNTER — Telehealth: Payer: Self-pay | Admitting: Family Medicine

## 2015-06-29 DIAGNOSIS — E785 Hyperlipidemia, unspecified: Secondary | ICD-10-CM

## 2015-06-29 DIAGNOSIS — R7301 Impaired fasting glucose: Secondary | ICD-10-CM

## 2015-06-29 DIAGNOSIS — I10 Essential (primary) hypertension: Secondary | ICD-10-CM

## 2015-06-29 NOTE — Telephone Encounter (Signed)
Patient called, would like to have lab work completed prior to her upcoming appt so the results can be discussed in person.  

## 2015-06-29 NOTE — Telephone Encounter (Signed)
CMP, Lipid, A1C, TSH

## 2015-06-30 NOTE — Telephone Encounter (Signed)
Labs ordered.

## 2015-07-14 ENCOUNTER — Encounter: Payer: Self-pay | Admitting: Family Medicine

## 2015-07-14 DIAGNOSIS — Z0000 Encounter for general adult medical examination without abnormal findings: Secondary | ICD-10-CM

## 2015-07-14 DIAGNOSIS — Z114 Encounter for screening for human immunodeficiency virus [HIV]: Secondary | ICD-10-CM

## 2015-07-14 DIAGNOSIS — Z1159 Encounter for screening for other viral diseases: Secondary | ICD-10-CM

## 2015-07-20 ENCOUNTER — Other Ambulatory Visit: Payer: Self-pay | Admitting: Obstetrics & Gynecology

## 2015-07-20 ENCOUNTER — Other Ambulatory Visit: Payer: Self-pay | Admitting: Family Medicine

## 2015-07-23 ENCOUNTER — Other Ambulatory Visit: Payer: Self-pay | Admitting: Family Medicine

## 2015-07-23 MED ORDER — AMLODIPINE BESYLATE 2.5 MG PO TABS: ORAL_TABLET | ORAL | Status: DC

## 2015-07-24 LAB — HEMOGLOBIN A1C
Hgb A1c MFr Bld: 5.5 % (ref <5.7)
MEAN PLASMA GLUCOSE: 111 mg/dL (ref <117)

## 2015-07-24 LAB — LIPID PANEL
Cholesterol: 152 mg/dL (ref 125–200)
HDL: 57 mg/dL (ref >=46)
LDL Cholesterol: 80 mg/dL (ref <130)
Total CHOL/HDL Ratio: 2.7 Ratio (ref <=5.0)
Triglycerides: 75 mg/dL (ref <150)
VLDL: 15 mg/dL (ref <30)

## 2015-07-24 LAB — COMPLETE METABOLIC PANEL WITH GFR
ALBUMIN: 3.8 g/dL (ref 3.6–5.1)
ALK PHOS: 54 U/L (ref 33–130)
ALT: 15 U/L (ref 6–29)
AST: 23 U/L (ref 10–35)
BILIRUBIN TOTAL: 0.8 mg/dL (ref 0.2–1.2)
BUN: 17 mg/dL (ref 7–25)
CHLORIDE: 108 mmol/L (ref 98–110)
CO2: 26 mmol/L (ref 20–31)
Calcium: 8.3 mg/dL — ABNORMAL LOW (ref 8.6–10.4)
Creat: 0.69 mg/dL (ref 0.50–0.99)
GFR, Est African American: >89 mL/min (ref >=60)
Glucose, Bld: 87 mg/dL (ref 65–99)
Potassium: 3.9 mmol/L (ref 3.5–5.3)
Sodium: 141 mmol/L (ref 135–146)
Total Protein: 5.8 g/dL — ABNORMAL LOW (ref 6.1–8.1)

## 2015-07-24 LAB — HEPATITIS C ANTIBODY: HCV AB: NEGATIVE

## 2015-07-24 LAB — TSH: TSH: 4.614 u[IU]/mL — ABNORMAL HIGH (ref 0.350–4.500)

## 2015-07-24 LAB — HIV ANTIBODY (ROUTINE TESTING W REFLEX): HIV 1&2 Ab, 4th Generation: NONREACTIVE

## 2015-07-27 ENCOUNTER — Other Ambulatory Visit: Payer: Self-pay | Admitting: *Deleted

## 2015-07-27 ENCOUNTER — Other Ambulatory Visit: Payer: Self-pay | Admitting: Obstetrics & Gynecology

## 2015-07-27 DIAGNOSIS — Z780 Asymptomatic menopausal state: Secondary | ICD-10-CM

## 2015-07-27 MED ORDER — MEDROXYPROGESTERONE ACETATE 2.5 MG PO TABS: 2.5000 mg | ORAL_TABLET | Freq: Every day | ORAL | Status: DC

## 2015-07-28 ENCOUNTER — Ambulatory Visit (INDEPENDENT_AMBULATORY_CARE_PROVIDER_SITE_OTHER): Payer: BLUE CROSS/BLUE SHIELD | Admitting: Family Medicine

## 2015-07-28 ENCOUNTER — Encounter: Payer: Self-pay | Admitting: Family Medicine

## 2015-07-28 VITALS — BP 105/64 | HR 65 | Wt 132.0 lb

## 2015-07-28 DIAGNOSIS — G4700 Insomnia, unspecified: Secondary | ICD-10-CM

## 2015-07-28 DIAGNOSIS — H3530 Unspecified macular degeneration: Secondary | ICD-10-CM

## 2015-07-28 DIAGNOSIS — Z0000 Encounter for general adult medical examination without abnormal findings: Secondary | ICD-10-CM | POA: Diagnosis not present

## 2015-07-28 DIAGNOSIS — R7989 Other specified abnormal findings of blood chemistry: Secondary | ICD-10-CM

## 2015-07-28 DIAGNOSIS — Z23 Encounter for immunization: Secondary | ICD-10-CM

## 2015-07-28 DIAGNOSIS — M8580 Other specified disorders of bone density and structure, unspecified site: Secondary | ICD-10-CM | POA: Insufficient documentation

## 2015-07-28 MED ORDER — LEVOTHYROXINE SODIUM 50 MCG PO TABS: 50.0000 ug | ORAL_TABLET | Freq: Every day | ORAL | Status: DC

## 2015-07-28 MED ORDER — SUVOREXANT 10 MG PO TABS: 1.0000 | ORAL_TABLET | Freq: Every day | ORAL | Status: DC

## 2015-07-28 NOTE — Progress Notes (Signed)
Subjective:     Rebecca Austin is a 64 y.o. female and is here for a comprehensive physical exam. The patient reports recently dx with macular degeneration. Has upped her leafy greens. Recently had a bone density scan which showed some mildly thin bones, osteopenia. She is following up with this with her GYN.  Social History   Social History  . Marital Status: Married    Spouse Name: N/A  . Number of Children: 1  . Years of Education: N/A   Occupational History  . 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 Smoking status: Former Smoker    Types: Cigarettes    Quit date: 12/12/2009  . Smokeless tobacco: Never Used  . Alcohol Use: 0.6 oz/week    1 Glasses of wine per week  . Drug Use: No  . Sexual Activity:    Partners: Male    Birth Control/ Protection: Post-menopausal     Comment: 2nd marriage,  69 yr old son in prison in [place] and gets out 07, walks daily.   Other Topics Concern  . Not on file   Social History Narrative   Working out regulary 3 times per week.    Health Maintenance  Topic Date Due  . INFLUENZA VACCINE  07/13/2015  . MAMMOGRAM  10/09/2016  . PAP SMEAR  04/15/2018  . COLONOSCOPY  12/12/2020  . TETANUS/TDAP  09/19/2021  . ZOSTAVAX  Completed  . Hepatitis C Screening  Completed  . HIV Screening  Completed    The following portions of the patient's history were reviewed and updated as appropriate: allergies, current medications, past family history, past medical history, past social history, past surgical history and problem list.  Review of Systems A comprehensive review of systems was negative.   Objective:    BP 105/64 mmHg  Pulse 65  Wt 132 lb (59.875 kg) General appearance: alert, cooperative and appears stated age Head: Normocephalic, without obvious abnormality, atraumatic Eyes: cnj clear. EOMI, PEERLA Ears: normal TM's and external ear canals both ears Nose: Nares normal. Septum midline. Mucosa normal. No drainage  or sinus tenderness. Throat: lips, mucosa, and tongue normal; teeth and gums normal Neck: no adenopathy, no carotid bruit, no JVD, supple, symmetrical, trachea midline and thyroid not enlarged, symmetric, no tenderness/mass/nodules Back: symmetric, no curvature. ROM normal. No CVA tenderness. Lungs: clear to auscultation bilaterally Heart: regular rate and rhythm, S1, S2 normal, no murmur, click, rub or gallop Abdomen: soft, non-tender; bowel sounds normal; no masses,  no organomegaly Extremities: extremities normal, atraumatic, no cyanosis or edema Pulses: 2+ and symmetric Skin: Skin color, texture, turgor normal. No rashes or lesions Lymph nodes: Cervical, supraclavicular, and axillary nodes normal. Neurologic: Alert and oriented X 3, normal strength and tone. Normal symmetric reflexes. Normal coordination and gait    Assessment:    Healthy female exam.      Plan:     See After Visit Summary for Counseling Recommendations  Keep up a regular exercise program and make sure you are eating a healthy diet Try to eat 4 servings of dairy a day, or if you are lactose intolerant take a calcium with vitamin D daily.  Your vaccines are up to date.  Mammogram due in October.  Flu vaccine given today.    Hypothyroid - increase thyroid to daily. Recheck level in 3-4 weeks.   INomsnia - currently on seroquel. We had discussed previously getting her off this medication bc of risks.   She  was on ambien years ago but felt like it quit working/ineffective.  Will try using Belsomra. Discussed side effect profile. May require prior authorization with her insurance.  Macular degeneration-added to problem list. Followed by ophthalmology.

## 2015-07-28 NOTE — Patient Instructions (Addendum)
Decrease seroquel to 1/2 tab at night for 5 days, then 1/4 tab nightly for 5 days. Then can start the new sleep medicine once able to pick it up.    Keep up a regular exercise program and make sure you are eating a healthy diet Try to eat 4 servings of dairy a day, or if you are lactose intolerant take a calcium with vitamin D daily.  Your vaccines are up to date.

## 2015-07-29 ENCOUNTER — Ambulatory Visit (INDEPENDENT_AMBULATORY_CARE_PROVIDER_SITE_OTHER): Payer: BLUE CROSS/BLUE SHIELD | Admitting: Obstetrics & Gynecology

## 2015-07-29 ENCOUNTER — Encounter: Payer: Self-pay | Admitting: Obstetrics & Gynecology

## 2015-07-29 VITALS — BP 116/69 | HR 82 | Resp 16 | Ht 62.0 in | Wt 132.0 lb

## 2015-07-29 DIAGNOSIS — M8580 Other specified disorders of bone density and structure, unspecified site: Secondary | ICD-10-CM

## 2015-07-29 NOTE — Progress Notes (Signed)
   Subjective:    Patient ID: Rebecca Austin, female    DOB: [birth year], 64 y.o.   MRN: [id number]  HPI  She is here to discuss her low bone mass.   Review of Systems     Objective:   Physical Exam        Assessment & Plan:  I offered Fosamas/actonel versus just adding calcium. She is going to do her research and lt me know.

## 2015-07-30 ENCOUNTER — Telehealth: Payer: Self-pay | Admitting: Family Medicine

## 2015-07-30 NOTE — Telephone Encounter (Signed)
Received fax for prior authorization on Belsomra sent through cover my meds and received authorization from 07/30/2015 - 12/11/2038 - CF

## 2015-08-08 ENCOUNTER — Encounter: Payer: Self-pay | Admitting: Family Medicine

## 2015-08-10 ENCOUNTER — Other Ambulatory Visit: Payer: Self-pay | Admitting: Family Medicine

## 2015-08-10 MED ORDER — SUVOREXANT 15 MG PO TABS: 1.0000 | ORAL_TABLET | Freq: Every day | ORAL | Status: DC

## 2015-08-18 ENCOUNTER — Other Ambulatory Visit: Payer: Self-pay | Admitting: Family Medicine

## 2015-09-13 ENCOUNTER — Encounter: Payer: Self-pay | Admitting: Family Medicine

## 2015-09-14 ENCOUNTER — Other Ambulatory Visit: Payer: Self-pay | Admitting: Family Medicine

## 2015-09-16 MED ORDER — QUETIAPINE FUMARATE 200 MG PO TABS: ORAL_TABLET | ORAL | Status: DC

## 2015-09-24 ENCOUNTER — Other Ambulatory Visit (HOSPITAL_COMMUNITY): Payer: Self-pay | Admitting: Obstetrics & Gynecology

## 2015-09-24 DIAGNOSIS — Z1231 Encounter for screening mammogram for malignant neoplasm of breast: Secondary | ICD-10-CM

## 2015-09-28 ENCOUNTER — Other Ambulatory Visit: Payer: Self-pay | Admitting: Obstetrics & Gynecology

## 2015-09-28 ENCOUNTER — Other Ambulatory Visit: Payer: Self-pay | Admitting: Family Medicine

## 2015-09-29 ENCOUNTER — Ambulatory Visit (INDEPENDENT_AMBULATORY_CARE_PROVIDER_SITE_OTHER): Payer: BLUE CROSS/BLUE SHIELD | Admitting: Obstetrics & Gynecology

## 2015-09-29 ENCOUNTER — Encounter: Payer: Self-pay | Admitting: Obstetrics & Gynecology

## 2015-09-29 VITALS — BP 121/73 | HR 65 | Resp 16 | Ht 62.0 in | Wt 132.0 lb

## 2015-09-29 DIAGNOSIS — N63 Unspecified lump in unspecified breast: Secondary | ICD-10-CM

## 2015-09-29 DIAGNOSIS — N6310 Unspecified lump in the right breast, unspecified quadrant: Secondary | ICD-10-CM | POA: Insufficient documentation

## 2015-09-29 NOTE — Progress Notes (Signed)
   Subjective:    Patient ID: Rebecca HamburgerLaurie A Austin, female    DOB: Aug 23, 1951, 64 y.o.   MRN: [id number]  HPI  64 year old female presents for right inner quadrant breast lump near sternum. She felt that the mass is growing over the past few months.. There's been no other changes in her breasts..Is a sebaceous cyst but the growth has concerned her.  There is no associated symptoms.  Review of Systems  Constitutional: Negative.   Respiratory: Negative.   Cardiovascular: Negative.   Genitourinary: Negative.   Psychiatric/Behavioral: Negative.        Objective:   Physical Exam  Constitutional: She is oriented to person, place, and time. She appears well-developed and well-nourished. No distress.  HENT:  Head: Normocephalic and atraumatic.  Eyes: Conjunctivae are normal.  Pulmonary/Chest: Right breast exhibits mass. Right breast exhibits no nipple discharge and no tenderness. Left breast exhibits no nipple discharge and no tenderness.    Neurological: She is alert and oriented to person, place, and time.  Skin: Skin is warm and dry.  Psychiatric: She has a normal mood and affect.  Vitals reviewed.         Assessment & Plan:  64 year old female with cyst position breast lump in the right breast  Diagnostic mammogram of the right breast. Patient up-to-date on Pap smear.

## 2015-10-06 ENCOUNTER — Ambulatory Visit
Admission: RE | Admit: 2015-10-06 | Discharge: 2015-10-06 | Disposition: A | Discharge status: Home / Self Care | Payer: BLUE CROSS/BLUE SHIELD | Source: Ambulatory Visit | Attending: Obstetrics & Gynecology | Admitting: Obstetrics & Gynecology

## 2015-10-06 ENCOUNTER — Other Ambulatory Visit: Payer: Self-pay | Admitting: Obstetrics & Gynecology

## 2015-10-06 DIAGNOSIS — L720 Epidermal cyst: Secondary | ICD-10-CM

## 2015-10-06 DIAGNOSIS — N63 Unspecified lump in unspecified breast: Secondary | ICD-10-CM

## 2015-10-15 ENCOUNTER — Ambulatory Visit: Payer: BLUE CROSS/BLUE SHIELD

## 2015-10-23 ENCOUNTER — Encounter: Payer: Self-pay | Admitting: Family Medicine

## 2015-10-23 LAB — PTH, INTACT AND CALCIUM
Calcium: 9.2 mg/dL (ref 8.4–10.5)
PTH: 29 pg/mL (ref 14–64)

## 2015-10-23 LAB — T3, FREE: T3 FREE: 2.1 pg/mL — AB (ref 2.3–4.2)

## 2015-10-23 LAB — T4, FREE: Free T4: 1.09 ng/dL (ref 0.80–1.80)

## 2015-10-23 LAB — MAGNESIUM: Magnesium: 2 mg/dL (ref 1.5–2.5)

## 2015-10-23 LAB — TSH: TSH: 2.576 u[IU]/mL (ref 0.350–4.500)

## 2015-10-28 ENCOUNTER — Telehealth: Payer: Self-pay | Admitting: *Deleted

## 2015-10-28 NOTE — Telephone Encounter (Signed)
Error

## 2015-10-29 ENCOUNTER — Encounter: Payer: Self-pay | Admitting: Family Medicine

## 2015-10-29 ENCOUNTER — Ambulatory Visit (INDEPENDENT_AMBULATORY_CARE_PROVIDER_SITE_OTHER): Payer: BLUE CROSS/BLUE SHIELD | Admitting: Family Medicine

## 2015-10-29 VITALS — BP 121/57 | HR 64 | Ht 62.0 in | Wt 132.3 lb

## 2015-10-29 DIAGNOSIS — F339 Major depressive disorder, recurrent, unspecified: Secondary | ICD-10-CM

## 2015-10-29 DIAGNOSIS — E039 Hypothyroidism, unspecified: Secondary | ICD-10-CM | POA: Diagnosis not present

## 2015-10-29 DIAGNOSIS — G4700 Insomnia, unspecified: Secondary | ICD-10-CM

## 2015-10-29 DIAGNOSIS — I10 Essential (primary) hypertension: Secondary | ICD-10-CM | POA: Diagnosis not present

## 2015-10-29 NOTE — Progress Notes (Signed)
   Subjective:    Patient ID: Rebecca Austin, female    DOB: 1951-01-02, 64 y.o.   MRN: [id number]  HPI Hypertension- Pt denies chest pain, SOB, dizziness, or heart palpitations.  Taking meds as directed w/o problems.  Denies medication side effects.    hypothyroid - dong well on new regimen. Repeat TSH was 2.5. She is now taking it seperately.   MDD/insomnia - she feels like it is working well.  She is happy with her regimen and odesn't want to decrease her medication.     Review of Systems     Objective:   Physical Exam  Constitutional: She is oriented to person, place, and time. She appears well-developed and well-nourished.  HENT:  Head: Normocephalic and atraumatic.  Right Ear: External ear normal.  Left Ear: External ear normal.  Nose: Nose normal.  Mouth/Throat: Oropharynx is clear and moist.  TMs and canals are clear.   Eyes: Conjunctivae and EOM are normal. Pupils are equal, round, and reactive to light.  Neck: Neck supple. No thyromegaly present.  Cardiovascular: Normal rate, regular rhythm and normal heart sounds.   Pulmonary/Chest: Effort normal and breath sounds normal. She has no wheezes.  Lymphadenopathy:    She has no cervical adenopathy.  Neurological: She is alert and oriented to person, place, and time.  Skin: Skin is warm and dry.  Psychiatric: She has a normal mood and affect.          Assessment & Plan:  HTN - well controlled. F/U in 6 months.  Hypothyroid - doing well now that she has seperated her thyroid med form supplements  MDD/insomnia - doing well on the seroquel. Continue to monitor glucose every 6 months.

## 2015-12-15 ENCOUNTER — Other Ambulatory Visit: Payer: Self-pay | Admitting: Family Medicine

## 2015-12-28 ENCOUNTER — Other Ambulatory Visit: Payer: Self-pay | Admitting: Family Medicine

## 2016-01-04 ENCOUNTER — Other Ambulatory Visit: Payer: Self-pay | Admitting: Family Medicine

## 2016-01-18 ENCOUNTER — Other Ambulatory Visit: Payer: Self-pay | Admitting: Obstetrics & Gynecology

## 2016-01-18 ENCOUNTER — Other Ambulatory Visit: Payer: Self-pay | Admitting: Family Medicine

## 2016-01-22 ENCOUNTER — Other Ambulatory Visit: Payer: Self-pay | Admitting: Obstetrics & Gynecology

## 2016-01-25 ENCOUNTER — Other Ambulatory Visit: Payer: Self-pay | Admitting: *Deleted

## 2016-01-25 DIAGNOSIS — N951 Menopausal and female climacteric states: Secondary | ICD-10-CM

## 2016-01-25 MED ORDER — ESTRADIOL 1 MG PO TABS: ORAL_TABLET | ORAL | Status: DC

## 2016-01-25 NOTE — Telephone Encounter (Signed)
RF authorization for Estrace sent to pt's mail  order pharmacy

## 2016-01-26 ENCOUNTER — Other Ambulatory Visit: Payer: Self-pay | Admitting: Obstetrics & Gynecology

## 2016-01-26 ENCOUNTER — Encounter: Payer: Self-pay | Admitting: Family Medicine

## 2016-01-26 ENCOUNTER — Other Ambulatory Visit: Payer: Self-pay | Admitting: Family Medicine

## 2016-01-26 MED ORDER — FLUOCINONIDE 0.05 % EX CREA: TOPICAL_CREAM | CUTANEOUS | Status: DC

## 2016-01-26 MED ORDER — FLUTICASONE PROPIONATE 50 MCG/ACT NA SUSP: 2.0000 | Freq: Every day | NASAL | Status: DC

## 2016-02-08 ENCOUNTER — Other Ambulatory Visit: Payer: Self-pay | Admitting: *Deleted

## 2016-02-08 MED ORDER — LEVOTHYROXINE SODIUM 50 MCG PO TABS: 50.0000 ug | ORAL_TABLET | Freq: Every day | ORAL | Status: DC

## 2016-03-07 ENCOUNTER — Other Ambulatory Visit: Payer: Self-pay | Admitting: Family Medicine

## 2016-04-02 ENCOUNTER — Other Ambulatory Visit: Payer: Self-pay | Admitting: Family Medicine

## 2016-04-02 ENCOUNTER — Other Ambulatory Visit: Payer: Self-pay | Admitting: Obstetrics & Gynecology

## 2016-04-07 ENCOUNTER — Other Ambulatory Visit: Payer: Self-pay | Admitting: *Deleted

## 2016-04-07 DIAGNOSIS — N951 Menopausal and female climacteric states: Secondary | ICD-10-CM

## 2016-04-07 MED ORDER — ESTRADIOL 1 MG PO TABS: ORAL_TABLET | ORAL | Status: DC

## 2016-04-07 NOTE — Telephone Encounter (Signed)
RF authorization for Estradiol  Sent to Mail order pharmacy.  Pt due for annual 6/17 per Dr Marice Potterove.

## 2016-04-21 ENCOUNTER — Other Ambulatory Visit: Payer: Self-pay | Admitting: Obstetrics & Gynecology

## 2016-04-21 ENCOUNTER — Other Ambulatory Visit (HOSPITAL_COMMUNITY): Payer: Self-pay | Admitting: Obstetrics & Gynecology

## 2016-04-21 DIAGNOSIS — N63 Unspecified lump in unspecified breast: Secondary | ICD-10-CM

## 2016-04-26 ENCOUNTER — Other Ambulatory Visit (HOSPITAL_COMMUNITY): Payer: Self-pay | Admitting: Obstetrics & Gynecology

## 2016-04-26 DIAGNOSIS — N63 Unspecified lump in unspecified breast: Secondary | ICD-10-CM

## 2016-05-03 ENCOUNTER — Other Ambulatory Visit: Payer: BLUE CROSS/BLUE SHIELD

## 2016-05-10 ENCOUNTER — Ambulatory Visit
Admission: RE | Admit: 2016-05-10 | Discharge: 2016-05-10 | Disposition: A | Discharge status: Home / Self Care | Payer: BLUE CROSS/BLUE SHIELD | Source: Ambulatory Visit | Attending: Obstetrics & Gynecology | Admitting: Obstetrics & Gynecology

## 2016-05-10 DIAGNOSIS — N63 Unspecified lump in unspecified breast: Secondary | ICD-10-CM

## 2016-05-30 ENCOUNTER — Other Ambulatory Visit: Payer: Self-pay | Admitting: Family Medicine

## 2016-06-04 ENCOUNTER — Other Ambulatory Visit: Payer: Self-pay | Admitting: Family Medicine

## 2016-06-08 ENCOUNTER — Other Ambulatory Visit: Payer: Self-pay | Admitting: *Deleted

## 2016-06-08 ENCOUNTER — Telehealth: Payer: Self-pay | Admitting: Family Medicine

## 2016-06-08 NOTE — Telephone Encounter (Signed)
ERROR

## 2016-06-08 NOTE — Telephone Encounter (Signed)
I called patient and let her know she is due for a f/u appt on her BP and she was driving and states she will call back to schedule appt time

## 2016-06-28 ENCOUNTER — Telehealth: Payer: Self-pay | Admitting: Family Medicine

## 2016-06-28 NOTE — Telephone Encounter (Signed)
Called pt and let them know they were due for a bp fu and she scheduled a cpe for Aug. 17. Thanks

## 2016-07-02 ENCOUNTER — Other Ambulatory Visit: Payer: Self-pay | Admitting: Obstetrics & Gynecology

## 2016-07-02 ENCOUNTER — Other Ambulatory Visit: Payer: Self-pay | Admitting: Family Medicine

## 2016-07-06 ENCOUNTER — Telehealth: Payer: Self-pay | Admitting: *Deleted

## 2016-07-06 NOTE — Telephone Encounter (Signed)
Received fax request from [facility] for RF on Estradiol 1 mg.  Request denied as pt has not been seen here in over a year and did have some issues with breast lump.  Pt will need to make appt for annual and then 1 RF will be given.

## 2016-07-08 ENCOUNTER — Telehealth: Payer: Self-pay

## 2016-07-08 DIAGNOSIS — E785 Hyperlipidemia, unspecified: Secondary | ICD-10-CM

## 2016-07-08 DIAGNOSIS — R7301 Impaired fasting glucose: Secondary | ICD-10-CM

## 2016-07-08 NOTE — Telephone Encounter (Signed)
OK for CMP, lipoid, A1C

## 2016-07-08 NOTE — Telephone Encounter (Signed)
Rebecca Austin is coming in for fasting labs for her annual exam in a week or so. What labs should I order?

## 2016-07-11 ENCOUNTER — Other Ambulatory Visit: Payer: Self-pay | Admitting: *Deleted

## 2016-07-11 DIAGNOSIS — N951 Menopausal and female climacteric states: Secondary | ICD-10-CM

## 2016-07-11 MED ORDER — ESTRADIOL 1 MG PO TABS: ORAL_TABLET | ORAL | 0 refills | Status: DC

## 2016-07-11 NOTE — Telephone Encounter (Signed)
Patient advised to go to lab. Orders sent.

## 2016-07-11 NOTE — Telephone Encounter (Signed)
Pt called with request for RF on Estradiol.  Pt has appt with Dr Marice Potter.  1 RF given until her appt.

## 2016-07-22 LAB — LIPID PANEL
CHOL/HDL RATIO: 2.4 ratio (ref <=5.0)
Cholesterol: 145 mg/dL (ref 125–200)
HDL: 60 mg/dL (ref >=46)
LDL CALC: 70 mg/dL (ref <130)
Triglycerides: 76 mg/dL (ref <150)
VLDL: 15 mg/dL (ref <30)

## 2016-07-22 LAB — COMPLETE METABOLIC PANEL WITH GFR
ALBUMIN: 4.1 g/dL (ref 3.6–5.1)
ALT: 18 U/L (ref 6–29)
AST: 23 U/L (ref 10–35)
Alkaline Phosphatase: 60 U/L (ref 33–130)
BUN: 12 mg/dL (ref 7–25)
CO2: 27 mmol/L (ref 20–31)
Calcium: 9.2 mg/dL (ref 8.6–10.4)
Chloride: 106 mmol/L (ref 98–110)
Creat: 0.73 mg/dL (ref 0.50–0.99)
GFR, EST NON AFRICAN AMERICAN: 89 mL/min (ref >=60)
GFR, Est African American: >89 mL/min (ref >=60)
GLUCOSE: 90 mg/dL (ref 65–99)
POTASSIUM: 3.9 mmol/L (ref 3.5–5.3)
SODIUM: 142 mmol/L (ref 135–146)
Total Bilirubin: 0.9 mg/dL (ref 0.2–1.2)
Total Protein: 6.3 g/dL (ref 6.1–8.1)

## 2016-07-22 LAB — HEMOGLOBIN A1C
HEMOGLOBIN A1C: 5.1 % (ref <5.7)
MEAN PLASMA GLUCOSE: 100 mg/dL

## 2016-07-26 ENCOUNTER — Encounter: Payer: Self-pay | Admitting: Obstetrics & Gynecology

## 2016-07-26 ENCOUNTER — Ambulatory Visit (INDEPENDENT_AMBULATORY_CARE_PROVIDER_SITE_OTHER): Payer: BLUE CROSS/BLUE SHIELD | Admitting: Obstetrics & Gynecology

## 2016-07-26 VITALS — BP 122/72 | HR 65 | Ht 62.0 in | Wt 132.0 lb

## 2016-07-26 DIAGNOSIS — E0590 Thyrotoxicosis, unspecified without thyrotoxic crisis or storm: Secondary | ICD-10-CM | POA: Diagnosis not present

## 2016-07-26 DIAGNOSIS — N951 Menopausal and female climacteric states: Secondary | ICD-10-CM | POA: Diagnosis not present

## 2016-07-26 DIAGNOSIS — Z01419 Encounter for gynecological examination (general) (routine) without abnormal findings: Secondary | ICD-10-CM

## 2016-07-26 MED ORDER — ESTRADIOL 1 MG PO TABS: ORAL_TABLET | ORAL | 4 refills | Status: DC

## 2016-07-26 MED ORDER — FLUTICASONE PROPIONATE 50 MCG/ACT NA SUSP: NASAL | 12 refills | Status: DC

## 2016-07-26 MED ORDER — MEDROXYPROGESTERONE ACETATE 2.5 MG PO TABS: ORAL_TABLET | ORAL | 4 refills | Status: DC

## 2016-07-26 NOTE — Progress Notes (Signed)
Subjective:    Rebecca Austin is a 65 y.o. MW  female who presents for an annual exam. The patient has no complaints today except for new onset night sweats since she started her levothyroxine. The patient is not currently sexually active. GYN screening history: last pap: was normal. The patient wears seatbelts: yes. The patient participates in regular exercise: yes. Has the patient ever been transfused or tattooed?: no. The patient reports that there is not domestic violence in her life.   Menstrual History: OB History    Gravida Para Term Preterm AB Living   2 1 1   1 1    SAB TAB Ectopic Multiple Live Births                  Menarche age: [age] No LMP recorded. Patient is postmenopausal.    The following portions of the patient's history were reviewed and updated as appropriate: allergies, current medications, past family history, past medical history, past social history, past surgical history and problem list.  Review of Systems Pertinent items are noted in HPI. Married for 36 1/2 years. Retired from [employer]. Colonoscopy due in 2022.   Objective:    BP 122/72   Pulse 65   Ht 5\' 2"  (1.575 m)   Wt 132 lb (59.9 kg)   BMI 24.14 kg/m   General Appearance:    Alert, cooperative, no distress, appears stated age  Head:    Normocephalic, without obvious abnormality, atraumatic  Eyes:    PERRL, conjunctiva/corneas clear, EOM's intact, fundi    benign, both eyes  Ears:    Normal TM's and external ear canals, both ears  Nose:   Nares normal, septum midline, mucosa normal, no drainage    or sinus tenderness  Throat:   Lips, mucosa, and tongue normal; teeth and gums normal  Neck:   Supple, symmetrical, trachea midline, no adenopathy;    thyroid:  no enlargement/tenderness/nodules; no carotid   bruit or JVD  Back:     Symmetric, no curvature, ROM normal, no CVA tenderness  Lungs:     Clear to auscultation bilaterally, respirations unlabored  Chest Wall:    No tenderness or deformity   Heart:    Regular rate and rhythm, S1 and S2 normal, no murmur, rub   or gallop  Breast Exam:    No tenderness, masses, or nipple abnormality  Abdomen:     Soft, non-tender, bowel sounds active all four quadrants,    no masses, no organomegaly  Genitalia:    Normal female without lesion, discharge or tenderness, NSSA, NT, mobile, no adnexal masses     Extremities:   Extremities normal, atraumatic, no cyanosis or edema  Pulses:   2+ and symmetric all extremities  Skin:   Skin color, texture, turgor normal, no rashes or lesions  Lymph nodes:   Cervical, supraclavicular, and axillary nodes normal  Neurologic:   CNII-XII intact, normal strength, sensation and reflexes    throughout   .    Assessment:    Healthy female exam.   hypothyroidism   Plan:     TSH   Refill HRT

## 2016-07-26 NOTE — Addendum Note (Signed)
Addended by: Granville LewisLARK, Moataz Tavis L on: 07/26/2016 03:22 PM   Modules accepted: Orders

## 2016-07-27 ENCOUNTER — Telehealth: Payer: Self-pay

## 2016-07-27 LAB — TSH: TSH: 1.69 m[IU]/L

## 2016-07-27 NOTE — Telephone Encounter (Signed)
Spoke with pt and she is aware of her TSH lab results

## 2016-07-28 ENCOUNTER — Ambulatory Visit (INDEPENDENT_AMBULATORY_CARE_PROVIDER_SITE_OTHER): Payer: BLUE CROSS/BLUE SHIELD | Admitting: Family Medicine

## 2016-07-28 ENCOUNTER — Encounter: Payer: Self-pay | Admitting: Family Medicine

## 2016-07-28 VITALS — BP 125/50 | HR 63 | Resp 16 | Ht 62.0 in | Wt 131.0 lb

## 2016-07-28 DIAGNOSIS — Z01419 Encounter for gynecological examination (general) (routine) without abnormal findings: Secondary | ICD-10-CM | POA: Diagnosis not present

## 2016-07-28 DIAGNOSIS — Z23 Encounter for immunization: Secondary | ICD-10-CM

## 2016-07-28 DIAGNOSIS — E785 Hyperlipidemia, unspecified: Secondary | ICD-10-CM

## 2016-07-28 NOTE — Progress Notes (Signed)
Subjective:     Rebecca Austin is a 65 y.o. female and is here for a comprehensive physical exam. The patient reports no problems.She's going to the gym 3 days a week and tries to exercise daily at home on a small trampoline. She gets 3-4 servings of dairy a day. She also wanted to mention that her Maurine MinisterDennis noticed that she's getting some erosion around a dental filling. He wanted her to mention it to me that it could be reflux related. She does take omeprazole daily. She had been drinking a lot of flavored water but also contain citric acid and she has cut that out over the last 6 months as well to see if that improves her symptoms.  Social History   Social History  . Marital status: Married    Spouse name: N/A  . Number of children: 1  . Years of education: N/A   Occupational History  . 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 Smoking status: Former Smoker    Types: Cigarettes    Quit date: 12/12/2009  . Smokeless tobacco: Never Used  . Alcohol use 0.6 oz/week    1 Glasses of wine per week  . Drug use: No  . Sexual activity: Not Currently    Partners: Male    Birth control/ protection: Post-menopausal     Comment: 2nd marriage,  63 yr old son in prison in [place] and gets out 07, walks daily.   Other Topics Concern  . Not on file   Social History Narrative   Working out regulary 3 times per week.    Health Maintenance  Topic Date Due  . INFLUENZA VACCINE  07/12/2016  . MAMMOGRAM  10/05/2017  . PAP SMEAR  04/15/2018  . COLONOSCOPY  12/12/2020  . TETANUS/TDAP  09/19/2021  . ZOSTAVAX  Completed  . Hepatitis C Screening  Completed  . HIV Screening  Completed    The following portions of the patient's history were reviewed and updated as appropriate: allergies, current medications, past family history, past medical history, past social history, past surgical history and problem list.  Review of Systems A comprehensive review of systems was negative.    Objective:    BP (!) 125/50 (BP Location: Right Arm, Patient Position: Sitting, Cuff Size: Normal)   Pulse 63   Resp 16   Ht 5\' 2"  (1.575 m)   Wt 131 lb (59.4 kg)   SpO2 98%   BMI 23.96 kg/m  General appearance: alert, cooperative and appears stated age Head: Normocephalic, without obvious abnormality, atraumatic Eyes: conj clear, EOMI, PEERLA Ears: normal TM's and external ear canals both ears Nose: Nares normal. Septum midline. Mucosa normal. No drainage or sinus tenderness. Throat: lips, mucosa, and tongue normal; teeth and gums normal Neck: no adenopathy, no carotid bruit, no JVD, supple, symmetrical, trachea midline and thyroid not enlarged, symmetric, no tenderness/mass/nodules Back: symmetric, no curvature. ROM normal. No CVA tenderness. Lungs: clear to auscultation bilaterally Heart: regular rate and rhythm, S1, S2 normal, no murmur, click, rub or gallop Abdomen: soft, non-tender; bowel sounds normal; no masses,  no organomegaly Extremities: extremities normal, atraumatic, no cyanosis or edema Pulses: 2+ and symmetric Skin: Skin color, texture, turgor normal. No rashes or lesions Lymph nodes: Cervical, supraclavicular, and axillary nodes normal. Neurologic: Alert and oriented X 3, normal strength and tone. Normal symmetric reflexes. Normal coordination and gait    Assessment:    Healthy female exam.  Plan:     See After Visit Summary for Counseling Recommendations   Keep up a regular exercise program and make sure you are eating a healthy diet Try to eat 4 servings of dairy a day, or if you are lactose intolerant take a calcium with vitamin D daily.  Your vaccines are up to date.   Flu vac given today.    Hyperlipidemia-she really wants to discontinue her statin. She's worked very hard on diet and exercise. Her last LDL was 70. We will stop the simvastatin and recheck lipids in 6 months.

## 2016-07-28 NOTE — Patient Instructions (Signed)
Keep up a regular exercise program and make sure you are eating a healthy diet Try to eat 4 servings of dairy a day, or if you are lactose intolerant take a calcium with vitamin D daily.  Your vaccines are up to date.   

## 2016-08-18 ENCOUNTER — Ambulatory Visit (INDEPENDENT_AMBULATORY_CARE_PROVIDER_SITE_OTHER): Payer: BLUE CROSS/BLUE SHIELD | Admitting: Sports Medicine

## 2016-08-18 ENCOUNTER — Ambulatory Visit (INDEPENDENT_AMBULATORY_CARE_PROVIDER_SITE_OTHER): Payer: BLUE CROSS/BLUE SHIELD

## 2016-08-18 ENCOUNTER — Encounter: Payer: Self-pay | Admitting: Sports Medicine

## 2016-08-18 DIAGNOSIS — M79662 Pain in left lower leg: Secondary | ICD-10-CM

## 2016-08-18 NOTE — Assessment & Plan Note (Signed)
Suspect tibial stress fracture. Aircast, return for custom orthotics, hip abductor rehabilitation. X-rays. She will ice for 20 minutes 3-4 times a day after running.

## 2016-08-18 NOTE — Patient Instructions (Signed)
Hip Rehabilitation Protocol:  1.  Side leg raises.  3x30 with no weight, then 3x15 with 2 lb ankle weight, then 3x15 with 5 lb ankle weight 2.  Standing hip rotation.  3x30 with no weight, then 3x15 with 2 lb ankle weight, then 3x15 with 5 lb ankle weight. 3.  Side step ups.  3x30 with no weight, then 3x15 with 5 lbs in backpack, then 3x15 with 10 lbs in backpack. 

## 2016-08-18 NOTE — Progress Notes (Signed)
   Subjective:    I'm seeing this patient as a consultation for:  Dr. Nani Gasseratherine Metheney  CC: Left ankle pain  HPI: This is a pleasant 65 year old female runner, she recently increased her treadmill distance and speed. She then started to get pain that she localizes along the posterior medial border of her left tibia. Worse with weightbearing. She has been icing it but really doing nothing else. Pain is moderate, persistent without radiation.  Past medical history, Surgical history, Family history not pertinant except as noted below, Social history, Allergies, and medications have been entered into the medical record, reviewed, and no changes needed.   Review of Systems: No headache, visual changes, nausea, vomiting, diarrhea, constipation, dizziness, abdominal pain, skin rash, fevers, chills, night sweats, weight loss, swollen lymph nodes, body aches, joint swelling, muscle aches, chest pain, shortness of breath, mood changes, visual or auditory hallucinations.   Objective:   General: Well Developed, well nourished, and in no acute distress.  Neuro/Psych: Alert and oriented x3, extra-ocular muscles intact, able to move all 4 extremities, sensation grossly intact. Skin: Warm and dry, no rashes noted.  Respiratory: Not using accessory muscles, speaking in full sentences, trachea midline.  Cardiovascular: Pulses palpable, no extremity edema. Abdomen: Does not appear distended. Left Ankle: No visible erythema or swelling. Range of motion is full in all directions. Strength is 5/5 in all directions. Stable lateral and medial ligaments; squeeze test and kleiger test unremarkable; Talar dome nontender; No pain at base of 5th MT; No tenderness over cuboid; No tenderness over N spot or navicular prominence No tenderness on posterior aspects of lateral and medial malleolus No sign of peroneal tendon subluxations; Negative tarsal tunnel tinel's Able to walk 4 steps. Tender to palpation along  the posterior medial tibial border discretely suggestive of a stress injury. Hip abductor's are weak bilaterally  There is a bit of haziness at the posterior medial tibial border suggestive of stress reaction.  Impression and Recommendations:   This case required medical decision making of moderate complexity.  Pain in left shin Suspect tibial stress fracture. Aircast, return for custom orthotics, hip abductor rehabilitation. X-rays. She will ice for 20 minutes 3-4 times a day after running.

## 2016-08-22 ENCOUNTER — Ambulatory Visit (INDEPENDENT_AMBULATORY_CARE_PROVIDER_SITE_OTHER): Payer: BLUE CROSS/BLUE SHIELD | Admitting: Sports Medicine

## 2016-08-22 DIAGNOSIS — M79662 Pain in left lower leg: Secondary | ICD-10-CM

## 2016-08-22 NOTE — Progress Notes (Signed)

## 2016-08-22 NOTE — Assessment & Plan Note (Signed)
X-rays did show periosteal reaction suggestive of stress injury, Aircast history provided fantastic and very rapid relief. Custom orthotics as above. Continue hip abductor exercises. Return to see me in one month.

## 2016-08-30 ENCOUNTER — Other Ambulatory Visit: Payer: Self-pay | Admitting: *Deleted

## 2016-08-30 MED ORDER — LOSARTAN POTASSIUM 100 MG PO TABS: 100.0000 mg | ORAL_TABLET | Freq: Every day | ORAL | 1 refills | Status: DC

## 2016-09-01 ENCOUNTER — Other Ambulatory Visit: Payer: Self-pay | Admitting: Family Medicine

## 2016-09-01 ENCOUNTER — Other Ambulatory Visit: Payer: Self-pay

## 2016-09-01 MED ORDER — LEVOTHYROXINE SODIUM 50 MCG PO TABS: 50.0000 ug | ORAL_TABLET | Freq: Every day | ORAL | 1 refills | Status: DC

## 2016-09-01 NOTE — Telephone Encounter (Signed)
Pharmacy request refill on levothyroxine , Rx on file is for . Will route to PCP for review.

## 2016-09-01 NOTE — Telephone Encounter (Signed)
rx sent for not .

## 2016-09-06 ENCOUNTER — Encounter: Payer: Self-pay | Admitting: Sports Medicine

## 2016-09-06 ENCOUNTER — Ambulatory Visit (INDEPENDENT_AMBULATORY_CARE_PROVIDER_SITE_OTHER): Payer: BLUE CROSS/BLUE SHIELD | Admitting: Sports Medicine

## 2016-09-06 ENCOUNTER — Ambulatory Visit: Payer: Self-pay | Admitting: Sports Medicine

## 2016-09-06 DIAGNOSIS — M79662 Pain in left lower leg: Secondary | ICD-10-CM | POA: Diagnosis not present

## 2016-09-06 NOTE — Progress Notes (Signed)
  Subjective:    CC: Follow-up  HPI: This is a pleasant 65 year old female, I been treating her for a tibial stress fracture, overall she's done extremely well with hip abductor rehabilitation, custom orthotics and an Aircast brace. Her tibial pain is now resolved and she is eager to get back into running, she does endorse some pain that she localizes at the metatarsal heads when she jumps up and down on her trampoline.  Past medical history:  Negative.  See flowsheet/record as well for more information.  Surgical history: Negative.  See flowsheet/record as well for more information.  Family history: Negative.  See flowsheet/record as well for more information.  Social history: Negative.  See flowsheet/record as well for more information.  Allergies, and medications have been entered into the medical record, reviewed, and no changes needed.   Review of Systems: No fevers, chills, night sweats, weight loss, chest pain, or shortness of breath.   Objective:    General: Well Developed, well nourished, and in no acute distress.  Neuro: Alert and oriented x3, extra-ocular muscles intact, sensation grossly intact.  HEENT: Normocephalic, atraumatic, pupils equal round reactive to light, neck supple, no masses, no lymphadenopathy, thyroid nonpalpable.  Skin: Warm and dry, no rashes. Cardiac: Regular rate and rhythm, no murmurs rubs or gallops, no lower extremity edema.  Respiratory: Clear to auscultation bilaterally. Not using accessory muscles, speaking in full sentences. Ankle: Left No visible erythema or swelling. Range of motion is full in all directions. Strength is 5/5 in all directions. Stable lateral and medial ligaments; squeeze test and kleiger test unremarkable; Talar dome nontender; No pain at base of 5th MT; No tenderness over cuboid; No tenderness over N spot or navicular prominence No tenderness on posterior aspects of lateral and medial malleolus No sign of peroneal tendon  subluxations; Negative tarsal tunnel tinel's Able to walk 4 steps. Left Foot inspection and palpation reveals breakdown of the transverse arch and a drop of MT heads. Abnormal callus is present between the second, third, and fourth metatarsal heads. Hammer toes are present. TTP under the metatarsal heads. Hip abductor strength is excellent bilaterally  Metatarsal pad placed in her orthotics.  Impression and Recommendations:    Pain in left shin This likely represented a tibial stress fracture, there was periosteal reaction on the posterior medial tibia. She is now ain't free for the most part with custom orthotics, hip abductor's are strong. Discontinue Aircast and she can restart running. Did have a little bit of pain in her posterior calf which is likely from wearing the Aircast, she also had some pain at the metatarsal heads when jumping on a trampoline suggestive of metatarsalgia, I did place metatarsal heads in both of orthotics.

## 2016-09-06 NOTE — Assessment & Plan Note (Signed)
This likely represented a tibial stress fracture, there was periosteal reaction on the posterior medial tibia. She is now ain't free for the most part with custom orthotics, hip abductor's are strong. Discontinue Aircast and she can restart running. Did have a little bit of pain in her posterior calf which is likely from wearing the Aircast, she also had some pain at the metatarsal heads when jumping on a trampoline suggestive of metatarsalgia, I did place metatarsal heads in both of orthotics.

## 2016-09-10 ENCOUNTER — Encounter: Payer: Self-pay | Admitting: Family Medicine

## 2016-09-12 ENCOUNTER — Other Ambulatory Visit: Payer: Self-pay

## 2016-09-12 MED ORDER — LEVOTHYROXINE SODIUM 50 MCG PO TABS: 50.0000 ug | ORAL_TABLET | Freq: Every day | ORAL | 1 refills | Status: DC

## 2016-09-12 NOTE — Telephone Encounter (Signed)
Spoke with Pt, Rx sent.

## 2016-09-20 ENCOUNTER — Ambulatory Visit: Payer: BLUE CROSS/BLUE SHIELD | Admitting: Sports Medicine

## 2016-10-05 ENCOUNTER — Other Ambulatory Visit: Payer: Self-pay | Admitting: *Deleted

## 2016-10-05 MED ORDER — AMLODIPINE BESYLATE 2.5 MG PO TABS: 2.5000 mg | ORAL_TABLET | Freq: Every day | ORAL | 0 refills | Status: DC

## 2016-10-27 ENCOUNTER — Other Ambulatory Visit: Payer: Self-pay

## 2016-10-27 MED ORDER — QUETIAPINE FUMARATE 200 MG PO TABS: 200.0000 mg | ORAL_TABLET | Freq: Every day | ORAL | 0 refills | Status: DC

## 2016-11-19 ENCOUNTER — Encounter: Payer: Self-pay | Admitting: Family Medicine

## 2016-11-21 ENCOUNTER — Other Ambulatory Visit (HOSPITAL_COMMUNITY): Payer: Self-pay | Admitting: Obstetrics & Gynecology

## 2016-11-21 DIAGNOSIS — R921 Mammographic calcification found on diagnostic imaging of breast: Secondary | ICD-10-CM

## 2016-11-21 MED ORDER — CARISOPRODOL 350 MG PO TABS: 350.0000 mg | ORAL_TABLET | Freq: Three times a day (TID) | ORAL | 0 refills | Status: DC | PRN

## 2016-11-30 ENCOUNTER — Other Ambulatory Visit (HOSPITAL_COMMUNITY): Payer: Self-pay | Admitting: Obstetrics & Gynecology

## 2016-11-30 ENCOUNTER — Other Ambulatory Visit: Payer: Self-pay

## 2016-11-30 DIAGNOSIS — R921 Mammographic calcification found on diagnostic imaging of breast: Secondary | ICD-10-CM

## 2016-12-01 ENCOUNTER — Inpatient Hospital Stay: Admission: RE | Admit: 2016-12-01 | Payer: BLUE CROSS/BLUE SHIELD | Source: Ambulatory Visit

## 2016-12-13 ENCOUNTER — Ambulatory Visit
Admission: RE | Admit: 2016-12-13 | Discharge: 2016-12-13 | Disposition: A | Discharge status: Home / Self Care | Payer: BLUE CROSS/BLUE SHIELD | Source: Ambulatory Visit | Attending: Obstetrics & Gynecology | Admitting: Obstetrics & Gynecology

## 2016-12-13 DIAGNOSIS — R921 Mammographic calcification found on diagnostic imaging of breast: Secondary | ICD-10-CM

## 2016-12-31 ENCOUNTER — Other Ambulatory Visit: Payer: Self-pay | Admitting: Family Medicine

## 2017-01-03 ENCOUNTER — Other Ambulatory Visit: Payer: Self-pay

## 2017-01-03 DIAGNOSIS — E7849 Other hyperlipidemia: Secondary | ICD-10-CM

## 2017-01-04 ENCOUNTER — Other Ambulatory Visit: Payer: Self-pay | Admitting: *Deleted

## 2017-01-04 MED ORDER — AMLODIPINE BESYLATE 2.5 MG PO TABS: 2.5000 mg | ORAL_TABLET | Freq: Every day | ORAL | 1 refills | Status: DC

## 2017-01-07 ENCOUNTER — Other Ambulatory Visit: Payer: Self-pay | Admitting: Family Medicine

## 2017-01-07 ENCOUNTER — Encounter: Payer: Self-pay | Admitting: Family Medicine

## 2017-01-09 MED ORDER — OMEPRAZOLE 20 MG PO CPDR: DELAYED_RELEASE_CAPSULE | ORAL | 3 refills | Status: DC

## 2017-01-10 ENCOUNTER — Other Ambulatory Visit: Payer: Self-pay

## 2017-01-10 ENCOUNTER — Other Ambulatory Visit: Payer: Self-pay | Admitting: *Deleted

## 2017-01-10 DIAGNOSIS — E7849 Other hyperlipidemia: Secondary | ICD-10-CM

## 2017-01-10 LAB — LIPID PANEL
Cholesterol: 174 mg/dL (ref <200)
HDL: 58 mg/dL (ref >50)
LDL CALC: 92 mg/dL (ref <100)
TRIGLYCERIDES: 118 mg/dL (ref <150)
Total CHOL/HDL Ratio: 3 Ratio (ref <5.0)
VLDL: 24 mg/dL (ref <30)

## 2017-01-10 MED ORDER — QUETIAPINE FUMARATE 200 MG PO TABS
200.0000 mg | ORAL_TABLET | Freq: Every day | ORAL | 1 refills | Status: DC
Start: 2017-01-10 — End: 2017-04-03

## 2017-01-11 NOTE — Progress Notes (Signed)
All labs are normal. 

## 2017-01-26 ENCOUNTER — Ambulatory Visit: Payer: BLUE CROSS/BLUE SHIELD | Admitting: Family Medicine

## 2017-02-09 ENCOUNTER — Other Ambulatory Visit: Payer: Self-pay | Admitting: *Deleted

## 2017-02-09 MED ORDER — LOSARTAN POTASSIUM 100 MG PO TABS: 100.0000 mg | ORAL_TABLET | Freq: Every day | ORAL | 0 refills | Status: DC

## 2017-02-13 ENCOUNTER — Other Ambulatory Visit: Payer: Self-pay

## 2017-02-13 MED ORDER — FLUTICASONE PROPIONATE 50 MCG/ACT NA SUSP: NASAL | 12 refills | Status: DC

## 2017-02-14 ENCOUNTER — Other Ambulatory Visit: Payer: Self-pay

## 2017-02-14 ENCOUNTER — Encounter: Payer: Self-pay | Admitting: Family Medicine

## 2017-02-14 MED ORDER — FLUTICASONE PROPIONATE 50 MCG/ACT NA SUSP: NASAL | 3 refills | Status: DC

## 2017-02-18 ENCOUNTER — Other Ambulatory Visit: Payer: Self-pay | Admitting: Family Medicine

## 2017-02-18 ENCOUNTER — Encounter: Payer: Self-pay | Admitting: Family Medicine

## 2017-02-19 ENCOUNTER — Other Ambulatory Visit: Payer: Self-pay

## 2017-02-19 MED ORDER — LOSARTAN POTASSIUM 100 MG PO TABS: 100.0000 mg | ORAL_TABLET | Freq: Every day | ORAL | 0 refills | Status: DC

## 2017-02-19 MED ORDER — LEVOTHYROXINE SODIUM 50 MCG PO TABS: 50.0000 ug | ORAL_TABLET | Freq: Every day | ORAL | 1 refills | Status: DC

## 2017-02-25 ENCOUNTER — Other Ambulatory Visit: Payer: Self-pay | Admitting: Family Medicine

## 2017-02-28 MED ORDER — LOSARTAN POTASSIUM 100 MG PO TABS: 100.0000 mg | ORAL_TABLET | Freq: Every day | ORAL | 1 refills | Status: DC

## 2017-04-01 ENCOUNTER — Encounter: Payer: Self-pay | Admitting: Obstetrics & Gynecology

## 2017-04-01 ENCOUNTER — Encounter: Payer: Self-pay | Admitting: Family Medicine

## 2017-04-03 ENCOUNTER — Other Ambulatory Visit: Payer: Self-pay | Admitting: *Deleted

## 2017-04-03 DIAGNOSIS — N951 Menopausal and female climacteric states: Secondary | ICD-10-CM

## 2017-04-03 MED ORDER — MEDROXYPROGESTERONE ACETATE 2.5 MG PO TABS: ORAL_TABLET | ORAL | 1 refills | Status: DC

## 2017-04-03 MED ORDER — OMEPRAZOLE 20 MG PO CPDR: DELAYED_RELEASE_CAPSULE | ORAL | 3 refills | Status: DC

## 2017-04-03 MED ORDER — ESTRADIOL 1 MG PO TABS: ORAL_TABLET | ORAL | 1 refills | Status: DC

## 2017-04-03 MED ORDER — AMLODIPINE BESYLATE 2.5 MG PO TABS: 2.5000 mg | ORAL_TABLET | Freq: Every day | ORAL | 1 refills | Status: DC

## 2017-04-03 MED ORDER — QUETIAPINE FUMARATE 200 MG PO TABS: 200.0000 mg | ORAL_TABLET | Freq: Every day | ORAL | 1 refills | Status: DC

## 2017-04-03 NOTE — Telephone Encounter (Signed)
RF authorization sent to [facility] for Esrtadiol and Provera.  Pt due for annual in July

## 2017-05-26 ENCOUNTER — Encounter: Payer: Self-pay | Admitting: Family Medicine

## 2017-07-29 ENCOUNTER — Other Ambulatory Visit: Payer: Self-pay | Admitting: Family Medicine

## 2017-09-07 ENCOUNTER — Telehealth: Payer: Self-pay

## 2017-09-07 DIAGNOSIS — E039 Hypothyroidism, unspecified: Secondary | ICD-10-CM

## 2017-09-07 DIAGNOSIS — R7301 Impaired fasting glucose: Secondary | ICD-10-CM

## 2017-09-07 NOTE — Telephone Encounter (Signed)
OK for CMP, TSH, and A1 C

## 2017-09-07 NOTE — Telephone Encounter (Signed)
Faxed to the lab and pt notified.

## 2017-09-07 NOTE — Telephone Encounter (Signed)
Pt is going to the lab this am, which labs would you like for her to have drawn?  Please advise.

## 2017-09-09 ENCOUNTER — Other Ambulatory Visit: Payer: Self-pay | Admitting: Obstetrics & Gynecology

## 2017-09-09 ENCOUNTER — Other Ambulatory Visit: Payer: Self-pay | Admitting: Family Medicine

## 2017-09-09 DIAGNOSIS — N951 Menopausal and female climacteric states: Secondary | ICD-10-CM

## 2017-09-12 ENCOUNTER — Encounter: Payer: Self-pay | Admitting: Family Medicine

## 2017-09-12 ENCOUNTER — Ambulatory Visit (INDEPENDENT_AMBULATORY_CARE_PROVIDER_SITE_OTHER): Payer: 59 | Admitting: Family Medicine

## 2017-09-12 VITALS — BP 129/61 | HR 64 | Ht 62.0 in | Wt 138.0 lb

## 2017-09-12 DIAGNOSIS — K2100 Gastro-esophageal reflux disease with esophagitis, without bleeding: Secondary | ICD-10-CM

## 2017-09-12 DIAGNOSIS — Z0000 Encounter for general adult medical examination without abnormal findings: Secondary | ICD-10-CM

## 2017-09-12 LAB — HEMOGLOBIN A1C
EAG (MMOL/L): 5.7 (calc)
Hgb A1c MFr Bld: 5.2 % of total Hgb (ref <5.7)
Mean Plasma Glucose: 103 (calc)

## 2017-09-12 LAB — COMPLETE METABOLIC PANEL WITH GFR
AG RATIO: 1.8 (calc) (ref 1.0–2.5)
ALBUMIN MSPROF: 4.3 g/dL (ref 3.6–5.1)
ALKALINE PHOSPHATASE (APISO): 65 U/L (ref 33–130)
ALT: 13 U/L (ref 6–29)
AST: 19 U/L (ref 10–35)
BILIRUBIN TOTAL: 0.7 mg/dL (ref 0.2–1.2)
BUN: 20 mg/dL (ref 7–25)
CHLORIDE: 105 mmol/L (ref 98–110)
CO2: 29 mmol/L (ref 20–32)
Calcium: 9.1 mg/dL (ref 8.6–10.4)
Creat: 0.84 mg/dL (ref 0.50–0.99)
GFR, EST AFRICAN AMERICAN: 85 mL/min/{1.73_m2} (ref >=60)
GFR, Est Non African American: 73 mL/min/{1.73_m2} (ref >=60)
Globulin: 2.4 g/dL (calc) (ref 1.9–3.7)
Glucose, Bld: 101 mg/dL — ABNORMAL HIGH (ref 65–99)
POTASSIUM: 4.2 mmol/L (ref 3.5–5.3)
Sodium: 140 mmol/L (ref 135–146)
TOTAL PROTEIN: 6.7 g/dL (ref 6.1–8.1)

## 2017-09-12 LAB — TSH: TSH: 3.14 m[IU]/L (ref 0.40–4.50)

## 2017-09-12 NOTE — Patient Instructions (Addendum)
Increase your omeprazole to 2 twice a day.  Health Maintenance, Female Adopting a healthy lifestyle and getting preventive care can go a long way to promote health and wellness. Talk with your health care provider about what schedule of regular examinations is right for you. This is a good chance for you to check in with your provider about disease prevention and staying healthy. In between checkups, there are plenty of things you can do on your own. Experts have done a lot of research about which lifestyle changes and preventive measures are most likely to keep you healthy. Ask your health care provider for more information. Weight and diet Eat a healthy diet  Be sure to include plenty of vegetables, fruits, low-fat dairy products, and lean protein.  Do not eat a lot of foods high in solid fats, added sugars, or salt.  Get regular exercise. This is one of the most important things you can do for your health. ? Most adults should exercise for at least 150 minutes each week. The exercise should increase your heart rate and make you sweat (moderate-intensity exercise). ? Most adults should also do strengthening exercises at least twice a week. This is in addition to the moderate-intensity exercise.  Maintain a healthy weight  Body mass index (BMI) is a measurement that can be used to identify possible weight problems. It estimates body fat based on height and weight. Your health care provider can help determine your BMI and help you achieve or maintain a healthy weight.  For females 6 years of age and older: ? A BMI below 18.5 is considered underweight. ? A BMI of 18.5 to 24.9 is normal. ? A BMI of 25 to 29.9 is considered overweight. ? A BMI of 30 and above is considered obese.  Watch levels of cholesterol and blood lipids  You should start having your blood tested for lipids and cholesterol at 64 years of age, then have this test every 5 years.  You may need to have your cholesterol  levels checked more often if: ? Your lipid or cholesterol levels are high. ? You are older than 64 years of age. ? You are at high risk for heart disease.  Cancer screening Lung Cancer  Lung cancer screening is recommended for adults 46-22 years old who are at high risk for lung cancer because of a history of smoking.  A yearly low-dose CT scan of the lungs is recommended for people who: ? Currently smoke. ? Have quit within the past 15 years. ? Have at least a 30-pack-year history of smoking. A pack year is smoking an average of one pack of cigarettes a day for 1 year.  Yearly screening should continue until it has been 15 years since you quit.  Yearly screening should stop if you develop a health problem that would prevent you from having lung cancer treatment.  Breast Cancer  Practice breast self-awareness. This means understanding how your breasts normally appear and feel.  It also means doing regular breast self-exams. Let your health care provider know about any changes, no matter how small.  If you are in your 20s or 30s, you should have a clinical breast exam (CBE) by a health care provider every 1-3 years as part of a regular health exam.  If you are 76 or older, have a CBE every year. Also consider having a breast X-ray (mammogram) every year.  If you have a family history of breast cancer, talk to your health care provider about  genetic screening.  If you are at high risk for breast cancer, talk to your health care provider about having an MRI and a mammogram every year.  Breast cancer gene (BRCA) assessment is recommended for women who have family members with BRCA-related cancers. BRCA-related cancers include: ? Breast. ? Ovarian. ? Tubal. ? Peritoneal cancers.  Results of the assessment will determine the need for genetic counseling and BRCA1 and BRCA2 testing.  Cervical Cancer Your health care provider may recommend that you be screened regularly for cancer of  the pelvic organs (ovaries, uterus, and vagina). This screening involves a pelvic examination, including checking for microscopic changes to the surface of your cervix (Pap test). You may be encouraged to have this screening done every 3 years, beginning at age 98.  For women ages 40-65, health care providers may recommend pelvic exams and Pap testing every 3 years, or they may recommend the Pap and pelvic exam, combined with testing for human papilloma virus (HPV), every 5 years. Some types of HPV increase your risk of cervical cancer. Testing for HPV may also be done on women of any age with unclear Pap test results.  Other health care providers may not recommend any screening for nonpregnant women who are considered low risk for pelvic cancer and who do not have symptoms. Ask your health care provider if a screening pelvic exam is right for you.  If you have had past treatment for cervical cancer or a condition that could lead to cancer, you need Pap tests and screening for cancer for at least 20 years after your treatment. If Pap tests have been discontinued, your risk factors (such as having a new sexual partner) need to be reassessed to determine if screening should resume. Some women have medical problems that increase the chance of getting cervical cancer. In these cases, your health care provider may recommend more frequent screening and Pap tests.  Colorectal Cancer  This type of cancer can be detected and often prevented.  Routine colorectal cancer screening usually begins at 64 years of age and continues through 64 years of age.  Your health care provider may recommend screening at an earlier age if you have risk factors for colon cancer.  Your health care provider may also recommend using home test kits to check for hidden blood in the stool.  A small camera at the end of a tube can be used to examine your colon directly (sigmoidoscopy or colonoscopy). This is done to check for the  earliest forms of colorectal cancer.  Routine screening usually begins at age 28.  Direct examination of the colon should be repeated every 5-10 years through 64 years of age. However, you may need to be screened more often if early forms of precancerous polyps or small growths are found.  Skin Cancer  Check your skin from head to toe regularly.  Tell your health care provider about any new moles or changes in moles, especially if there is a change in a mole's shape or color.  Also tell your health care provider if you have a mole that is larger than the size of a pencil eraser.  Always use sunscreen. Apply sunscreen liberally and repeatedly throughout the day.  Protect yourself by wearing long sleeves, pants, a wide-brimmed hat, and sunglasses whenever you are outside.  Heart disease, diabetes, and high blood pressure  High blood pressure causes heart disease and increases the risk of stroke. High blood pressure is more likely to develop in: ? People  who have blood pressure in the high end of the normal range (130-139/85-89 mm Hg). ? People who are overweight or obese. ? People who are African American.  If you are 5-22 years of age, have your blood pressure checked every 3-5 years. If you are 69 years of age or older, have your blood pressure checked every year. You should have your blood pressure measured twice-once when you are at a hospital or clinic, and once when you are not at a hospital or clinic. Record the average of the two measurements. To check your blood pressure when you are not at a hospital or clinic, you can use: ? An automated blood pressure machine at a pharmacy. ? A home blood pressure monitor.  If you are between 29 years and 27 years old, ask your health care provider if you should take aspirin to prevent strokes.  Have regular diabetes screenings. This involves taking a blood sample to check your fasting blood sugar level. ? If you are at a normal weight and  have a low risk for diabetes, have this test once every three years after 64 years of age. ? If you are overweight and have a high risk for diabetes, consider being tested at a younger age or more often. Preventing infection Hepatitis B  If you have a higher risk for hepatitis B, you should be screened for this virus. You are considered at high risk for hepatitis B if: ? You were born in a country where hepatitis B is common. Ask your health care provider which countries are considered high risk. ? Your parents were born in a high-risk country, and you have not been immunized against hepatitis B (hepatitis B vaccine). ? You have HIV or AIDS. ? You use needles to inject street drugs. ? You live with someone who has hepatitis B. ? You have had sex with someone who has hepatitis B. ? You get hemodialysis treatment. ? You take certain medicines for conditions, including cancer, organ transplantation, and autoimmune conditions.  Hepatitis C  Blood testing is recommended for: ? Everyone born from [birth year] through 1963. ? Anyone with known risk factors for hepatitis C.  Sexually transmitted infections (STIs)  You should be screened for sexually transmitted infections (STIs) including gonorrhea and chlamydia if: ? You are sexually active and are younger than 64 years of age. ? You are older than 63 years of age and your health care provider tells you that you are at risk for this type of infection. ? Your sexual activity has changed since you were last screened and you are at an increased risk for chlamydia or gonorrhea. Ask your health care provider if you are at risk.  If you do not have HIV, but are at risk, it may be recommended that you take a prescription medicine daily to prevent HIV infection. This is called pre-exposure prophylaxis (PrEP). You are considered at risk if: ? You are sexually active and do not regularly use condoms or know the HIV status of your partner(s). ? You take drugs by  injection. ? You are sexually active with a partner who has HIV.  Talk with your health care provider about whether you are at high risk of being infected with HIV. If you choose to begin PrEP, you should first be tested for HIV. You should then be tested every 3 months for as long as you are taking PrEP. Pregnancy  If you are premenopausal and you may become pregnant, ask your health care  provider about preconception counseling.  If you may become pregnant, take 400 to 800 micrograms (mcg) of folic acid every day.  If you want to prevent pregnancy, talk to your health care provider about birth control (contraception). Osteoporosis and menopause  Osteoporosis is a disease in which the bones lose minerals and strength with aging. This can result in serious bone fractures. Your risk for osteoporosis can be identified using a bone density scan.  If you are 35 years of age or older, or if you are at risk for osteoporosis and fractures, ask your health care provider if you should be screened.  Ask your health care provider whether you should take a calcium or vitamin D supplement to lower your risk for osteoporosis.  Menopause may have certain physical symptoms and risks.  Hormone replacement therapy may reduce some of these symptoms and risks. Talk to your health care provider about whether hormone replacement therapy is right for you. Follow these instructions at home:  Schedule regular health, dental, and eye exams.  Stay current with your immunizations.  Do not use any tobacco products including cigarettes, chewing tobacco, or electronic cigarettes.  If you are pregnant, do not drink alcohol.  If you are breastfeeding, limit how much and how often you drink alcohol.  Limit alcohol intake to no more than 1 drink per day for nonpregnant women. One drink equals 12 ounces of beer, 5 ounces of wine, or 1 ounces of hard liquor.  Do not use street drugs.  Do not share needles.  Ask  your health care provider for help if you need support or information about quitting drugs.  Tell your health care provider if you often feel depressed.  Tell your health care provider if you have ever been abused or do not feel safe at home. This information is not intended to replace advice given to you by your health care provider. Make sure you discuss any questions you have with your health care provider. Document Released: 06/13/2011 Document Revised: 05/05/2016 Document Reviewed: 09/01/2015 Elsevier Interactive Patient Education  [employer].

## 2017-09-12 NOTE — Progress Notes (Signed)
Subjective:     Rebecca Austin is a 66 y.o. female and is here for a comprehensive physical exam. The patient reports problems - reflux worse over the last 6-12 months. .  She feels like her omeprazole is not as effective as it was previously. She is having some break through sxs. Reflux worse when she is exercising.  She feels it may be affecting the enamel on her teeth.   Social History   Social History  . Marital status: Married    Spouse name: N/A  . Number of children: 1  . Years of education: N/A   Occupational History  . 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 Smoking status: Former Smoker    Types: Cigarettes    Quit date: 12/12/2009  . Smokeless tobacco: Never Used  . Alcohol use 0.6 oz/week    1 Glasses of wine per week  . Drug use: No  . Sexual activity: Not Currently    Partners: Male    Birth control/ protection: Post-menopausal     Comment: 2nd marriage,  82 yr old son in prison in [place] and gets out 07, walks daily.   Other Topics Concern  . Not on file   Social History Narrative   Working out regulary 3 times per week.    Health Maintenance  Topic Date Due  . INFLUENZA VACCINE  09/12/2018 (Originally 07/12/2017)  . PAP SMEAR  04/15/2018  . MAMMOGRAM  12/13/2018  . COLONOSCOPY  12/12/2020  . TETANUS/TDAP  09/19/2021  . Hepatitis C Screening  Completed  . HIV Screening  Completed    The following portions of the patient's history were reviewed and updated as appropriate: allergies, current medications, past family history, past medical history, past social history, past surgical history and problem list.  Review of Systems A comprehensive review of systems was negative.   Objective:    BP 129/61   Pulse 64   Ht  (1.575 m)   Wt 138 lb (62.6 kg)   SpO2 100%   BMI 25.24 kg/m  General appearance: alert, cooperative and appears stated age Head: Normocephalic, without obvious abnormality, atraumatic Eyes: conj clear, EOMI,  PEERLA Ears: normal TM's and external ear canals both ears Nose: Nares normal. Septum midline. Mucosa normal. No drainage or sinus tenderness. Throat: lips, mucosa, and tongue normal; teeth and gums normal Neck: no adenopathy, no carotid bruit, no JVD, supple, symmetrical, trachea midline and thyroid not enlarged, symmetric, no tenderness/mass/nodules Back: symmetric, no curvature. ROM normal. No CVA tenderness. Lungs: clear to auscultation bilaterally Heart: regular rate and rhythm, S1, S2 normal, no murmur, click, rub or gallop Abdomen: soft, non-tender; bowel sounds normal; no masses,  no organomegaly Extremities: extremities normal, atraumatic, no cyanosis or edema Pulses: 2+ and symmetric Skin: Skin color, texture, turgor normal. No rashes or lesions Lymph nodes: Cervical, supraclavicular, and axillary nodes normal. Neurologic: Alert and oriented X 3, normal strength and tone. Normal symmetric reflexes. Normal coordination and gait    Assessment:    Healthy female exam.     Plan:     See After Visit Summary for Counseling Recommendations   Keep up a regular exercise program and make sure you are eating a healthy diet Try to eat 4 servings of dairy a day, or if you are lactose intolerant take a calcium with vitamin D daily.  Your vaccines are up to date.   GERD -  We discussed possible options including increasing her  PPI to twice a day. We could also change PPIs though I explained that there are no hent head trials proving that one PPIs better than another. Though there is a newer one on the market that the longer acting that we could consider. Also discussed looking at her diet making sure that she is avoiding foods that her trigger foods and avoiding eating a lot right before exercise. If she still having breakthrough symptoms after increasing her omeprazole to twice a day then she will give me a call back in a couple weeks and we can refer her to GI for further evaluation if  needed.

## 2017-09-19 ENCOUNTER — Encounter: Payer: Self-pay | Admitting: Obstetrics & Gynecology

## 2017-09-19 ENCOUNTER — Ambulatory Visit (INDEPENDENT_AMBULATORY_CARE_PROVIDER_SITE_OTHER): Payer: 59 | Admitting: Obstetrics & Gynecology

## 2017-09-19 VITALS — BP 132/73 | HR 62 | Resp 16 | Ht 62.0 in | Wt 140.0 lb

## 2017-09-19 DIAGNOSIS — Z1151 Encounter for screening for human papillomavirus (HPV): Secondary | ICD-10-CM | POA: Diagnosis not present

## 2017-09-19 DIAGNOSIS — Z124 Encounter for screening for malignant neoplasm of cervix: Secondary | ICD-10-CM | POA: Diagnosis not present

## 2017-09-19 DIAGNOSIS — Z01419 Encounter for gynecological examination (general) (routine) without abnormal findings: Secondary | ICD-10-CM | POA: Diagnosis not present

## 2017-09-19 NOTE — Progress Notes (Signed)
Subjective:    Rebecca Austin is a 66 y.o. MW  female who presents for an annual exam. The patient has no complaints today. The patient is not currently sexually active. GYN screening history: last pap: was normal. The patient wears seatbelts: yes. The patient participates in regular exercise: yes. Has the patient ever been transfused or tattooed?: no. The patient reports that there is not domestic violence in her life.   Menstrual History: OB History    Gravida Para Term Preterm AB Living   _0 SAB TAB Ectopic Multiple Live Births                  Menarche age: 21 No LMP recorded. Patient is postmenopausal.    The following portions of the patient's history were reviewed and updated as appropriate: allergies, current medications, past family history, past medical history, past social history, past surgical history and problem list.  Review of Systems Pertinent items are noted in HPI.   Married for 17 years FH- + breast cancer- mom and niece. Patient's sister had negative BRCA testing No gyn/colon cancer Colonoscopy due in 2020 She will get her flu vaccine next week. Mammogram UTD   Objective:    BP 132/73   Pulse 62   Resp 16   Ht _1  (1.575 m)   Wt 63.5 kg (140 lb)   BMI 25.61 kg/m   General Appearance:    Alert, cooperative, no distress, appears stated age  Head:    Normocephalic, without obvious abnormality, atraumatic  Eyes:    PERRL, conjunctiva/corneas clear, EOM's intact, fundi    benign, both eyes  Ears:    Normal TM's and external ear canals, both ears  Nose:   Nares normal, septum midline, mucosa normal, no drainage    or sinus tenderness  Throat:   Lips, mucosa, and tongue normal; teeth and gums normal  Neck:   Supple, symmetrical, trachea midline, no adenopathy;    thyroid:  no enlargement/tenderness/nodules; no carotid   bruit or JVD  Back:     Symmetric, no curvature, ROM normal, no CVA tenderness  Lungs:     Clear to auscultation  bilaterally, respirations unlabored  Chest Wall:    No tenderness or deformity   Heart:    Regular rate and rhythm, S1 and S2 normal, no murmur, rub   or gallop  Breast Exam:    No tenderness, masses, or nipple abnormality  Abdomen:     Soft, non-tender, bowel sounds active all four quadrants,    no masses, no organomegaly  Genitalia:    Normal female without lesion, discharge or tenderness, moderate atrphy, no hair, NSSA, NT, normal adnexal exam (no masses)     Extremities:   Extremities normal, atraumatic, no cyanosis or edema  Pulses:   2+ and symmetric all extremities  Skin:   Skin color, texture, turgor normal, no rashes or lesions  Lymph nodes:   Cervical, supraclavicular, and axillary nodes normal  Neurologic:   CNII-XII intact, normal strength, sensation and reflexes    throughout  .    Assessment:    Healthy female exam.    Plan:     Thin prep Pap smear. with cotesting

## 2017-09-21 ENCOUNTER — Ambulatory Visit: Payer: 59

## 2017-09-22 LAB — CYTOLOGY - PAP
Diagnosis: NEGATIVE
HPV (WINDOPATH): NOT DETECTED

## 2017-09-28 ENCOUNTER — Other Ambulatory Visit: Payer: Self-pay | Admitting: *Deleted

## 2017-09-28 DIAGNOSIS — N951 Menopausal and female climacteric states: Secondary | ICD-10-CM

## 2017-09-28 MED ORDER — MEDROXYPROGESTERONE ACETATE 2.5 MG PO TABS: ORAL_TABLET | ORAL | 1 refills | Status: DC

## 2017-09-30 ENCOUNTER — Other Ambulatory Visit: Payer: Self-pay | Admitting: Family Medicine

## 2017-09-30 ENCOUNTER — Other Ambulatory Visit: Payer: Self-pay | Admitting: Obstetrics & Gynecology

## 2017-09-30 DIAGNOSIS — N951 Menopausal and female climacteric states: Secondary | ICD-10-CM

## 2017-10-04 ENCOUNTER — Encounter: Payer: Self-pay | Admitting: Obstetrics & Gynecology

## 2017-10-25 ENCOUNTER — Other Ambulatory Visit: Payer: Self-pay | Admitting: Family Medicine

## 2017-12-14 ENCOUNTER — Encounter: Payer: Self-pay | Admitting: Family Medicine

## 2017-12-15 MED ORDER — FLUTICASONE PROPIONATE 50 MCG/ACT NA SUSP: NASAL | 3 refills | Status: DC

## 2017-12-15 MED ORDER — LEVOTHYROXINE SODIUM 50 MCG PO TABS: 50.0000 ug | ORAL_TABLET | Freq: Every day | ORAL | 1 refills | Status: DC

## 2017-12-19 ENCOUNTER — Other Ambulatory Visit: Payer: Self-pay | Admitting: *Deleted

## 2017-12-19 MED ORDER — LOSARTAN POTASSIUM 100 MG PO TABS: 100.0000 mg | ORAL_TABLET | Freq: Every day | ORAL | 1 refills | Status: DC

## 2017-12-23 ENCOUNTER — Encounter: Payer: Self-pay | Admitting: Family Medicine

## 2017-12-25 MED ORDER — OMEPRAZOLE 20 MG PO CPDR: DELAYED_RELEASE_CAPSULE | ORAL | 3 refills | Status: DC

## 2017-12-30 ENCOUNTER — Encounter: Payer: Self-pay | Admitting: Family Medicine

## 2017-12-30 ENCOUNTER — Encounter: Payer: Self-pay | Admitting: Obstetrics & Gynecology

## 2018-01-01 ENCOUNTER — Other Ambulatory Visit: Payer: Self-pay | Admitting: *Deleted

## 2018-01-01 DIAGNOSIS — N951 Menopausal and female climacteric states: Secondary | ICD-10-CM

## 2018-01-01 MED ORDER — QUETIAPINE FUMARATE 200 MG PO TABS: 200.0000 mg | ORAL_TABLET | Freq: Every day | ORAL | 1 refills | Status: DC

## 2018-01-01 MED ORDER — ESTRADIOL 1 MG PO TABS: 1.0000 mg | ORAL_TABLET | Freq: Two times a day (BID) | ORAL | 2 refills | Status: DC

## 2018-01-01 MED ORDER — AMLODIPINE BESYLATE 2.5 MG PO TABS: 2.5000 mg | ORAL_TABLET | Freq: Every day | ORAL | 1 refills | Status: DC

## 2018-01-01 MED ORDER — MEDROXYPROGESTERONE ACETATE 2.5 MG PO TABS: ORAL_TABLET | ORAL | 2 refills | Status: DC

## 2018-01-09 ENCOUNTER — Encounter: Payer: Self-pay | Admitting: Family Medicine

## 2018-01-09 MED ORDER — FLUOCINONIDE 0.05 % EX CREA: TOPICAL_CREAM | CUTANEOUS | 1 refills | Status: DC

## 2018-01-19 ENCOUNTER — Ambulatory Visit: Payer: 59 | Admitting: Sports Medicine

## 2018-03-03 ENCOUNTER — Other Ambulatory Visit: Payer: Self-pay | Admitting: Family Medicine

## 2018-03-03 ENCOUNTER — Other Ambulatory Visit: Payer: Self-pay | Admitting: Obstetrics & Gynecology

## 2018-03-03 DIAGNOSIS — N951 Menopausal and female climacteric states: Secondary | ICD-10-CM

## 2018-03-06 ENCOUNTER — Other Ambulatory Visit: Payer: Self-pay | Admitting: *Deleted

## 2018-03-06 MED ORDER — FLUOCINONIDE 0.05 % EX CREA: TOPICAL_CREAM | CUTANEOUS | 1 refills | Status: DC

## 2018-06-09 ENCOUNTER — Other Ambulatory Visit: Payer: Self-pay | Admitting: Family Medicine

## 2018-07-19 ENCOUNTER — Encounter: Payer: Self-pay | Admitting: Family Medicine

## 2018-07-21 ENCOUNTER — Encounter: Payer: Self-pay | Admitting: Family Medicine

## 2018-07-23 ENCOUNTER — Other Ambulatory Visit: Payer: Self-pay | Admitting: *Deleted

## 2018-07-23 MED ORDER — LEVOTHYROXINE SODIUM 50 MCG PO TABS: 50.0000 ug | ORAL_TABLET | Freq: Every day | ORAL | 1 refills | Status: DC

## 2018-07-23 MED ORDER — QUETIAPINE FUMARATE 200 MG PO TABS: 200.0000 mg | ORAL_TABLET | Freq: Every day | ORAL | 1 refills | Status: DC

## 2018-08-01 ENCOUNTER — Encounter: Payer: Self-pay | Admitting: Family Medicine

## 2018-08-02 ENCOUNTER — Other Ambulatory Visit: Payer: Self-pay | Admitting: Family Medicine

## 2018-08-02 MED ORDER — CARISOPRODOL 350 MG PO TABS: 350.0000 mg | ORAL_TABLET | Freq: Three times a day (TID) | ORAL | 0 refills | Status: DC | PRN

## 2018-09-04 ENCOUNTER — Other Ambulatory Visit: Payer: Self-pay | Admitting: *Deleted

## 2018-09-04 MED ORDER — FLUTICASONE PROPIONATE 50 MCG/ACT NA SUSP: NASAL | 3 refills | Status: DC

## 2018-09-16 ENCOUNTER — Encounter: Payer: Self-pay | Admitting: Family Medicine

## 2018-09-17 ENCOUNTER — Other Ambulatory Visit: Payer: Self-pay | Admitting: *Deleted

## 2018-09-17 DIAGNOSIS — N951 Menopausal and female climacteric states: Secondary | ICD-10-CM

## 2018-09-17 MED ORDER — OMEPRAZOLE 20 MG PO CPDR: DELAYED_RELEASE_CAPSULE | ORAL | 0 refills | Status: DC

## 2018-09-17 MED ORDER — MEDROXYPROGESTERONE ACETATE 2.5 MG PO TABS: ORAL_TABLET | ORAL | 2 refills | Status: DC

## 2018-09-17 NOTE — Telephone Encounter (Signed)
Pt requesting her Provera be sent to [facility].  They are her new pharmacy.  RX sent

## 2018-09-22 ENCOUNTER — Encounter: Payer: Self-pay | Admitting: Family Medicine

## 2018-09-24 MED ORDER — AMLODIPINE BESYLATE 2.5 MG PO TABS: 2.5000 mg | ORAL_TABLET | Freq: Every day | ORAL | 0 refills | Status: DC

## 2018-10-03 ENCOUNTER — Other Ambulatory Visit: Payer: Self-pay | Admitting: *Deleted

## 2018-10-03 DIAGNOSIS — N951 Menopausal and female climacteric states: Secondary | ICD-10-CM

## 2018-10-03 MED ORDER — ESTRADIOL 1 MG PO TABS: 1.0000 mg | ORAL_TABLET | Freq: Two times a day (BID) | ORAL | 0 refills | Status: DC

## 2018-10-11 ENCOUNTER — Ambulatory Visit (INDEPENDENT_AMBULATORY_CARE_PROVIDER_SITE_OTHER): Payer: Medicare Other | Admitting: Obstetrics & Gynecology

## 2018-10-11 ENCOUNTER — Encounter: Payer: Self-pay | Admitting: Obstetrics & Gynecology

## 2018-10-11 VITALS — BP 140/79 | HR 62 | Resp 16 | Ht 62.0 in | Wt 140.0 lb

## 2018-10-11 DIAGNOSIS — Z124 Encounter for screening for malignant neoplasm of cervix: Secondary | ICD-10-CM

## 2018-10-11 DIAGNOSIS — Z01419 Encounter for gynecological examination (general) (routine) without abnormal findings: Secondary | ICD-10-CM

## 2018-10-11 NOTE — Progress Notes (Signed)
Subjective:    Rebecca Austin is a 67 y.o. married P1 ( 3 great grands) female who presents for an annual exam. The patient has no complaints today. Hot flashes have resolved. The patient is not currently sexually active due to ED. GYN screening history: last pap: was normal. The patient wears seatbelts: yes. The patient participates in regular exercise: yes. (swims 5 days per week, and uses a mini trampoline 5 days) Has the patient ever been transfused or tattooed?: yes. The patient reports that there is not domestic violence in her life.   Menstrual History: OB History    Gravida  2   Para  1   Term  1   Preterm      AB  1   Living  1     SAB      TAB      Ectopic      Multiple      Live Births              Menarche age: 83 No LMP recorded. Patient is postmenopausal.    The following portions of the patient's history were reviewed and updated as appropriate: allergies, current medications, past family history, past medical history, past social history, past surgical history and problem list.  Review of Systems Pertinent items are noted in HPI.   FH-+ breast cancer in mother and niece, no colon/gyn cancer, her sister was BRCA negative Married for 40 years Retired from [employer]   Objective:    BP 140/79   Pulse 62   Resp 16   Ht _0  (1.575 m)   Wt 140 lb (63.5 kg)   BMI 25.61 kg/m   General Appearance:    Alert, cooperative, no distress, appears stated age  Head:    Normocephalic, without obvious abnormality, atraumatic  Eyes:    PERRL, conjunctiva/corneas clear, EOM's intact, fundi    benign, both eyes  Ears:    Normal TM's and external ear canals, both ears  Nose:   Nares normal, septum midline, mucosa normal, no drainage    or sinus tenderness  Throat:   Lips, mucosa, and tongue normal; teeth and gums normal  Neck:   Supple, symmetrical, trachea midline, no adenopathy;    thyroid:  no enlargement/tenderness/nodules; no carotid   bruit or  JVD  Back:     Symmetric, no curvature, ROM normal, no CVA tenderness  Lungs:     Clear to auscultation bilaterally, respirations unlabored  Chest Wall:    No tenderness or deformity   Heart:    Regular rate and rhythm, S1 and S2 normal, no murmur, rub   or gallop  Breast Exam:    No tenderness, masses, or nipple abnormality  Abdomen:     Soft, non-tender, bowel sounds active all four quadrants,    no masses, no organomegaly  Genitalia:    Normal female without lesion, discharge or tenderness, vulvo vaginal atrophy, no vaginal or cervical lesions, normal size and shape, anteverted, mobile, non-tender, normal adnexal exam      Extremities:   Extremities normal, atraumatic, no cyanosis or edema  Pulses:   2+ and symmetric all extremities  Skin:   Skin color, texture, turgor normal, no rashes or lesions  Lymph nodes:   Cervical, supraclavicular, and axillary nodes normal  Neurologic:   CNII-XII intact, normal strength, sensation and reflexes    throughout  .    Assessment:    Healthy female exam.    Plan:  Rec'd that she continue her healthy lifestyle Rec vulvar self exam monthly

## 2018-10-21 ENCOUNTER — Other Ambulatory Visit: Payer: Self-pay | Admitting: Family Medicine

## 2018-10-22 ENCOUNTER — Telehealth: Payer: Self-pay | Admitting: Family Medicine

## 2018-10-22 ENCOUNTER — Encounter: Payer: Self-pay | Admitting: Family Medicine

## 2018-10-22 ENCOUNTER — Ambulatory Visit (INDEPENDENT_AMBULATORY_CARE_PROVIDER_SITE_OTHER): Payer: Medicare Other | Admitting: Family Medicine

## 2018-10-22 VITALS — BP 138/68 | HR 63 | Ht 61.42 in | Wt 142.0 lb

## 2018-10-22 DIAGNOSIS — Z6826 Body mass index (BMI) 26.0-26.9, adult: Secondary | ICD-10-CM

## 2018-10-22 DIAGNOSIS — R7301 Impaired fasting glucose: Secondary | ICD-10-CM

## 2018-10-22 DIAGNOSIS — E039 Hypothyroidism, unspecified: Secondary | ICD-10-CM

## 2018-10-22 DIAGNOSIS — Z0000 Encounter for general adult medical examination without abnormal findings: Secondary | ICD-10-CM

## 2018-10-22 DIAGNOSIS — Z23 Encounter for immunization: Secondary | ICD-10-CM | POA: Diagnosis not present

## 2018-10-22 DIAGNOSIS — E7849 Other hyperlipidemia: Secondary | ICD-10-CM | POA: Diagnosis not present

## 2018-10-22 NOTE — Patient Instructions (Signed)
  Rebecca Austin , Thank you for taking time to come for your Medicare Wellness Visit. I appreciate your ongoing commitment to your health goals. Please review the following plan we discussed and let me know if I can assist you in the future.   These are the goals we discussed: Goals   None     This is a list of the screening recommended for you and due dates:  Health Maintenance  Topic Date Due  . Mammogram  12/13/2018  . Pneumonia vaccines (2 of 2 - PCV13) 10/23/2019  . Pap Smear  09/19/2020  . Colon Cancer Screening  12/12/2020  . Tetanus Vaccine  09/19/2021  . Flu Shot  Completed  . DEXA scan (bone density measurement)  Completed  .  Hepatitis C: One time screening is recommended by Center for Disease Control  (CDC) for  adults born from [birth year] through 1963.   Completed  . HIV Screening  Completed

## 2018-10-22 NOTE — Telephone Encounter (Signed)
Please call patient: I looked at her last T score for her last bone density and based on the numbers she actually does not need a repeat bone density for 10 years which would be 20 2016.  We can always do it sooner if she has a problem or concern.  Was just make sure getting adequate calcium with vitamin D intake and regular weightbearing exercise.

## 2018-10-22 NOTE — Telephone Encounter (Signed)
Pt advised of recommendations. She is NOT having any problems/issues.Marland KitchenMarland KitchenHeath Gold, CMA

## 2018-10-22 NOTE — Progress Notes (Signed)
Subjective:    Rebecca Austin is a 67 y.o. female who presents for a Welcome to Medicare exam. She has been gaining some weight unintentionally.  She has gained several pounds this year and feels like she has not changed her diet or her activity level.  Her last TSH was around 3.  And that was a year ago.  Today's  Review of Systems Comprehensive ROS is negative except for HPI        Objective:    Today's Vitals   10/22/18 1025  BP: 138/68  Pulse: 63  SpO2: 98%  Weight: 142 lb (64.4 kg)  Height: 5' 1.42" (1.56 m)  Body mass index is 26.47 kg/m.  Medications Outpatient Encounter Medications as of 10/22/2018  Medication Sig  . amLODipine (NORVASC) 2.5 MG tablet TAKE 1 TABLET (2.5 MG TOTAL) BY MOUTH DAILY. MUST KEEP APPOINTMENT  . Calcium Citrate-Vitamin D (CALCIUM CITRATE + D) 250-200 MG-UNIT TABS Take 2 tablets by mouth 2 (two) times daily.  Marland Kitchen estradiol (ESTRACE) 1 MG tablet Take 1 tablet (1 mg total) by mouth 2 (two) times daily.  . fluticasone (FLONASE) 50 MCG/ACT nasal spray USE 2 SPRAYS IN BOTH NOSTRILS DAILY  . levothyroxine (SYNTHROID, LEVOTHROID) 50 MCG tablet Take 1 tablet (50 mcg total) by mouth daily.  Marland Kitchen losartan (COZAAR) 100 MG tablet TAKE 1 TABLET BY MOUTH  DAILY  . medroxyPROGESTERone (PROVERA) 2.5 MG tablet TAKE 1 TABLET BY MOUTH  DAILY  . Multiple Vitamins-Minerals (PRESERVISION AREDS PO) Take by mouth.  . naproxen sodium (ANAPROX) 220 MG tablet Take 220 mg by mouth as needed.   Marland Kitchen omeprazole (PRILOSEC) 20 MG capsule TAKE 1 BY MOUTH DAILY  . QUEtiapine (SEROQUEL) 200 MG tablet Take 1 tablet (200 mg total) by mouth at bedtime.  . [DISCONTINUED] carisoprodol (SOMA) 350 MG tablet Take 1 tablet (350 mg total) by mouth 3 (three) times daily as needed for muscle spasms. (Patient not taking: Reported on 10/11/2018)  . [DISCONTINUED] fluocinonide cream (LIDEX) 0.05 % APPLY TO AFFECTED AREA TWICE A DAY (Patient not taking: Reported on 10/11/2018)   No  facility-administered encounter medications on file as of 10/22/2018.      History: Past Medical History:  Diagnosis Date  . DDD (degenerative disc disease), lumbar   . Depression   . IFG (impaired fasting glucose)   . Obesity   . Plantar fasciitis    See's Dr. Ihor Gully  . Thyroid disease   . Tobacco abuse    Past Surgical History:  Procedure Laterality Date  . CESAREAN SECTION    . fluroscopy     LESI/Dr. Yves Dill  . TONSILLECTOMY     adenoids, and uvula removed for sleep apnes  . TUBAL LIGATION      Family History  Problem Relation Age of Onset  . Osteoporosis Sister   . Hypertension Father   . Depression Father   . Breast cancer Mother 33       postmenopausal breast CA  . Diabetes Maternal Grandfather    Social History   Occupational History  . Occupation: REtired [employer] cabin crew   Tobacco Use  . Smoking status: Former Smoker    Types: Cigarettes    Last attempt to quit: 12/12/2009    Years since quitting: 8.8  . Smokeless tobacco: Never Used  Substance and Sexual Activity  . Alcohol use: Yes    Alcohol/week: 1.0 standard drinks    Types: 1 Glasses of wine per week  . Drug use: No  .  Sexual activity: Not Currently    Partners: Male    Birth control/protection: Post-menopausal    Comment: 2nd marriage,  63 yr old son in prison in [place] and gets out 07, walks daily.    Tobacco Counseling Counseling given: Not Answered   Immunizations and Health Maintenance Immunization History  Administered Date(s) Administered  . H1N1 11/20/2008  . Influenza Split 09/20/2011  . Influenza Whole 09/15/2008  . Influenza, High Dose Seasonal PF 10/22/2018  . Influenza,inj,Quad PF,6+ Mos 09/19/2013, 07/28/2015, 07/28/2016  . Pneumococcal Polysaccharide-23 10/22/2018  . Tdap 09/20/2011  . Zoster 09/19/2013   There are no preventive care reminders to display for this patient.  Activities of Daily Living In your present state of health, do you have any difficulty  performing the following activities: 10/22/2018  Hearing? Y  Vision? Y  Difficulty concentrating or making decisions? N  Walking or climbing stairs? N  Dressing or bathing? N  Doing errands, shopping? N  Preparing Food and eating ? N  Using the Toilet? N  Do you have problems with loss of bowel control? N  Managing your Medications? N  Managing your Finances? N  Some recent data might be hidden    Physical Exam   Physical Exam  Constitutional: She is oriented to person, place, and time. She appears well-developed and well-nourished.  HENT:  Head: Normocephalic and atraumatic.  Right Ear: External ear normal.  Left Ear: External ear normal.  Nose: Nose normal.  Mouth/Throat: Oropharynx is clear and moist.  TMs and canals are clear.   Eyes: Pupils are equal, round, and reactive to light. Conjunctivae and EOM are normal.  Neck: Neck supple. No thyromegaly present.  Cardiovascular: Normal rate, regular rhythm and normal heart sounds.  Pulmonary/Chest: Effort normal and breath sounds normal. She has no wheezes.  Lymphadenopathy:    She has no cervical adenopathy.  Neurological: She is alert and oriented to person, place, and time.  Skin: Skin is warm and dry. No pallor.  Psychiatric: She has a normal mood and affect. Her behavior is normal.  Vitals reviewed.   (optional), or other factors deemed appropriate based on the beneficiary's medical and social history and current clinical standards.  Advanced Directives: Does Patient Have a Medical Advance Directive?: Yes Type of Advance Directive: Living will, Healthcare Power of Attorney Copy of Healthcare Power of Attorney in Chart?: No - copy requested    Assessment:    This is a routine wellness examination for this patient .   Vision/Hearing screen No exam data present  Dietary issues and exercise activities discussed:  She is very active.      Goals   None    Depression Screen PHQ 2/9 Scores 10/22/2018  PHQ - 2  Score 0     Fall Risk Fall Risk  10/22/2018  Falls in the past year? 1  Number falls in past yr: 0  Injury with Fall? 0  Risk for fall due to : Other (Comment)  Risk for fall due to: Comment pt reports that her shoe got caught on step  Follow up Education provided    Cognitive Function:     6CIT Screen 10/22/2018  What Year? 0 points  What month? 0 points  What time? 0 points  Count back from 20 0 points  Months in reverse 0 points  Repeat phrase 0 points  Total Score 0    Patient Care Team: Agapito Games, MD as PCP - General (Family Medicine) Allie Bossier, MD as  Attending Physician (Obstetrics and Gynecology) Jackelyn Hoehn, MD (Dermatology)     Plan:   [facility] Welcome Exam.   I have personally reviewed and noted the following in the patient's chart:   . Medical and social history . Use of alcohol, tobacco or illicit drugs  . Current medications and supplements . Functional ability and status . Nutritional status . Physical activity . Advanced directives . List of other physicians . Hospitalizations, surgeries, and ER visits in previous 12 months . Vitals . Screenings to include cognitive, depression, and falls . Referrals and appointments . EKG today shows rate of 55 bpm, sinus bradycardia no acute ST-T wave changes otherwise normal.  No significant change from previous 10 years ago except for the bradycardia.  In addition, I have reviewed and discussed with patient certain preventive protocols, quality metrics, and best practice recommendations. A written personalized care plan for preventive services as well as general preventive health recommendations were provided to patient.     Nani Gasser, MD 10/22/2018

## 2018-10-23 ENCOUNTER — Encounter: Payer: Self-pay | Admitting: Family Medicine

## 2018-10-23 LAB — COMPLETE METABOLIC PANEL WITH GFR
AG Ratio: 2.3 (calc) (ref 1.0–2.5)
ALKALINE PHOSPHATASE (APISO): 54 U/L (ref 33–130)
ALT: 13 U/L (ref 6–29)
AST: 18 U/L (ref 10–35)
Albumin: 4.3 g/dL (ref 3.6–5.1)
BUN: 15 mg/dL (ref 7–25)
CALCIUM: 9.3 mg/dL (ref 8.6–10.4)
CO2: 30 mmol/L (ref 20–32)
CREATININE: 0.88 mg/dL (ref 0.50–0.99)
Chloride: 105 mmol/L (ref 98–110)
GFR, EST NON AFRICAN AMERICAN: 69 mL/min/{1.73_m2} (ref >=60)
GFR, Est African American: 80 mL/min/{1.73_m2} (ref >=60)
Globulin: 1.9 g/dL (calc) (ref 1.9–3.7)
Glucose, Bld: 86 mg/dL (ref 65–139)
POTASSIUM: 4 mmol/L (ref 3.5–5.3)
Sodium: 142 mmol/L (ref 135–146)
Total Bilirubin: 0.6 mg/dL (ref 0.2–1.2)
Total Protein: 6.2 g/dL (ref 6.1–8.1)

## 2018-10-23 LAB — HEMOGLOBIN A1C
Hgb A1c MFr Bld: 5.5 % of total Hgb (ref <5.7)
Mean Plasma Glucose: 111 (calc)
eAG (mmol/L): 6.2 (calc)

## 2018-10-23 LAB — LIPID PANEL
CHOL/HDL RATIO: 5.2 (calc) — AB (ref <5.0)
CHOLESTEROL: 256 mg/dL — AB (ref <200)
HDL: 49 mg/dL — ABNORMAL LOW (ref >50)
LDL Cholesterol (Calc): 175 mg/dL (calc) — ABNORMAL HIGH
NON-HDL CHOLESTEROL (CALC): 207 mg/dL — AB (ref <130)
TRIGLYCERIDES: 168 mg/dL — AB (ref <150)

## 2018-10-23 LAB — TSH: TSH: 4.9 mIU/L — ABNORMAL HIGH (ref 0.40–4.50)

## 2018-10-24 MED ORDER — AMLODIPINE BESYLATE 5 MG PO TABS: 5.0000 mg | ORAL_TABLET | Freq: Every day | ORAL | 1 refills | Status: DC

## 2018-10-24 NOTE — Addendum Note (Signed)
Addended by: Chalmers CaterUTTLE, Dlisa Barnwell H on: 10/24/2018 08:42 AM   Modules accepted: Orders

## 2018-11-06 ENCOUNTER — Encounter: Payer: Self-pay | Admitting: Family Medicine

## 2018-12-02 ENCOUNTER — Other Ambulatory Visit: Payer: Self-pay | Admitting: Family Medicine

## 2018-12-02 ENCOUNTER — Other Ambulatory Visit: Payer: Self-pay | Admitting: Obstetrics & Gynecology

## 2018-12-02 DIAGNOSIS — N951 Menopausal and female climacteric states: Secondary | ICD-10-CM

## 2018-12-08 ENCOUNTER — Other Ambulatory Visit: Payer: Self-pay | Admitting: Obstetrics & Gynecology

## 2018-12-08 ENCOUNTER — Other Ambulatory Visit: Payer: Self-pay | Admitting: Family Medicine

## 2018-12-08 DIAGNOSIS — N951 Menopausal and female climacteric states: Secondary | ICD-10-CM

## 2018-12-15 ENCOUNTER — Encounter: Payer: Self-pay | Admitting: Family Medicine

## 2018-12-18 MED ORDER — LOSARTAN POTASSIUM 100 MG PO TABS: 100.0000 mg | ORAL_TABLET | Freq: Every day | ORAL | 1 refills | Status: DC

## 2019-01-08 ENCOUNTER — Other Ambulatory Visit: Payer: Self-pay | Admitting: Family Medicine

## 2019-01-15 ENCOUNTER — Encounter: Payer: Self-pay | Admitting: Sports Medicine

## 2019-01-15 ENCOUNTER — Ambulatory Visit (INDEPENDENT_AMBULATORY_CARE_PROVIDER_SITE_OTHER): Payer: Medicare Other | Admitting: Sports Medicine

## 2019-01-15 ENCOUNTER — Ambulatory Visit (INDEPENDENT_AMBULATORY_CARE_PROVIDER_SITE_OTHER): Payer: Medicare Other

## 2019-01-15 DIAGNOSIS — M25551 Pain in right hip: Secondary | ICD-10-CM | POA: Diagnosis not present

## 2019-01-15 DIAGNOSIS — M48061 Spinal stenosis, lumbar region without neurogenic claudication: Secondary | ICD-10-CM | POA: Insufficient documentation

## 2019-01-15 DIAGNOSIS — M79651 Pain in right thigh: Secondary | ICD-10-CM | POA: Diagnosis not present

## 2019-01-15 MED ORDER — NAPROXEN 500 MG PO TABS: 500.0000 mg | ORAL_TABLET | Freq: Two times a day (BID) | ORAL | 3 refills | Status: DC

## 2019-01-15 NOTE — Assessment & Plan Note (Signed)
Suspect rectus femoris strain. Cross train on the stationary bike and elliptical. Strap thigh with a compressive dressing, adding naproxen 500 twice daily for 2 weeks. Hip and pelvis x-rays. Hip/quad rehab exercises given. Return to see me in 1 month if needed.

## 2019-01-15 NOTE — Progress Notes (Signed)
Subjective:    CC: Right thigh pain  HPI: This is a pleasant 68 year old female, she has been doing some more exercising lately, marching on a mini trampoline.  Unfortunately over the past month she has developed pain that she localizes on her upper anterior right thigh, worse with hip flexion.  Moderate, persistent, localized without radiation, no trauma, no mechanical symptoms.  Does not really have any related back pain and nothing radiating down past the knee.  I reviewed the past medical history, family history, social history, surgical history, and allergies today and no changes were needed.  Please see the problem list section below in epic for further details.  Past Medical History: Past Medical History:  Diagnosis Date  . DDD (degenerative disc disease), lumbar   . Depression   . IFG (impaired fasting glucose)   . Obesity   . Plantar fasciitis    See's Dr. Ihor Gully  . Thyroid disease   . Tobacco abuse    Past Surgical History: Past Surgical History:  Procedure Laterality Date  . CESAREAN SECTION    . fluroscopy     LESI/Dr. Yves Dill  . TONSILLECTOMY     adenoids, and uvula removed for sleep apnes  . TUBAL LIGATION     Social History: Social History   Socioeconomic History  . Marital status: Married    Spouse name: Not on file  . Number of children: 1  . Years of education: Not on file  . Highest education level: Not on file  Occupational History  . Occupation: REtired Air cabin crew   Social Needs  . Financial resource strain: Not on file  . Food insecurity:    Worry: Not on file    Inability: Not on file  . Transportation needs:    Medical: Not on file    Non-medical: Not on file  Tobacco Use  . Smoking status: Former Smoker    Types: Cigarettes    Last attempt to quit: 12/12/2009    Years since quitting: 9.0  . Smokeless tobacco: Never Used  Substance and Sexual Activity  . Alcohol use: Yes    Alcohol/week: 1.0 standard drinks    Types: 1  Glasses of wine per week  . Drug use: No  . Sexual activity: Not Currently    Partners: Male    Birth control/protection: Post-menopausal    Comment: 2nd marriage,  51 yr old son in prison in [place] and gets out 07, walks daily.  Lifestyle  . Physical activity:    Days per week: Not on file    Minutes per session: Not on file  . Stress: Not on file  Relationships  . Social connections:    Talks on phone: Not on file    Gets together: Not on file    Attends religious service: Not on file    Active member of club or organization: Not on file    Attends meetings of clubs or organizations: Not on file    Relationship status: Not on file  Other Topics Concern  . Not on file  Social History Narrative   Working out regulary 3 times per week.    Family History: Family History  Problem Relation Age of Onset  . Osteoporosis Sister   . Hypertension Father   . Depression Father   . Breast cancer Mother 34       postmenopausal breast CA  . Diabetes Maternal Grandfather    Allergies: Allergies  Allergen Reactions  . Hydrochlorothiazide Other (See Comments)  Hyponatremia   . Codeine Rash  . Penicillins Rash   Medications: See med rec.  Review of Systems: No fevers, chills, night sweats, weight loss, chest pain, or shortness of breath.   Objective:    General: Well Developed, well nourished, and in no acute distress.  Neuro: Alert and oriented x3, extra-ocular muscles intact, sensation grossly intact.  HEENT: Normocephalic, atraumatic, pupils equal round reactive to light, neck supple, no masses, no lymphadenopathy, thyroid nonpalpable.  Skin: Warm and dry, no rashes. Cardiac: Regular rate and rhythm, no murmurs rubs or gallops, no lower extremity edema.  Respiratory: Clear to auscultation bilaterally. Not using accessory muscles, speaking in full sentences. Right hip: ROM IR: 60 Deg, ER: 60 Deg, Flexion: 120 Deg, Extension: 100 Deg, Abduction: 45 Deg, Adduction: 45  Deg Strength IR: 5/5, ER: 5/5, Flexion: 5/5, Extension: 5/5, Abduction: 5/5, Adduction: 5/5 Pelvic alignment unremarkable to inspection and palpation. Standing hip rotation and gait without trendelenburg / unsteadiness. Greater trochanter without tenderness to palpation. No tenderness over piriformis. No SI joint tenderness and normal minimal SI movement. No pain with resisted hip flexion with the knee in flexion, I am able to reproduce her pain with hip flexion with the knee in extension isolating the rectus femoris.  Impression and Recommendations:    Right thigh pain Suspect rectus femoris strain. Cross train on the stationary bike and elliptical. Strap thigh with a compressive dressing, adding naproxen 500 twice daily for 2 weeks. Hip and pelvis x-rays. Hip/quad rehab exercises given. Return to see me in 1 month if needed. ___________________________________________ [name]. Benjamin Stain, M.D., ABFM., CAQSM. Primary Care and Sports Medicine [facility]  Adjunct Professor of Family Medicine  [employer]

## 2019-01-28 ENCOUNTER — Ambulatory Visit (INDEPENDENT_AMBULATORY_CARE_PROVIDER_SITE_OTHER): Payer: Medicare Other | Admitting: Sports Medicine

## 2019-01-28 ENCOUNTER — Ambulatory Visit (INDEPENDENT_AMBULATORY_CARE_PROVIDER_SITE_OTHER): Payer: Medicare Other

## 2019-01-28 DIAGNOSIS — M48061 Spinal stenosis, lumbar region without neurogenic claudication: Secondary | ICD-10-CM

## 2019-01-28 DIAGNOSIS — M79651 Pain in right thigh: Secondary | ICD-10-CM | POA: Diagnosis not present

## 2019-01-28 DIAGNOSIS — M4807 Spinal stenosis, lumbosacral region: Secondary | ICD-10-CM | POA: Diagnosis not present

## 2019-01-28 DIAGNOSIS — M5126 Other intervertebral disc displacement, lumbar region: Secondary | ICD-10-CM | POA: Diagnosis not present

## 2019-01-28 DIAGNOSIS — M545 Low back pain: Secondary | ICD-10-CM | POA: Diagnosis not present

## 2019-01-28 MED ORDER — GABAPENTIN 300 MG PO CAPS: ORAL_CAPSULE | ORAL | 3 refills | Status: DC

## 2019-01-28 MED ORDER — PREDNISONE 50 MG PO TABS: ORAL_TABLET | ORAL | 0 refills | Status: DC

## 2019-01-28 NOTE — Assessment & Plan Note (Signed)
Likely radicular and related to spinal stenosis, now bilateral. Has failed greater than 6 weeks of physician directed conservative measures including treatment before she saw me. Adding 5 days of prednisone, gabapentin. MRI today for interventional planning.

## 2019-01-28 NOTE — Progress Notes (Signed)
Subjective:    CC: Back and leg pain  HPI: Rebecca Austin returns, she is a pleasant 68 year old female, initially she presented with symptoms and signs concerning for a quadriceps strain.  After failure of conservative treatment her pain has declared itself in the back with radiation down the anterior aspect of both thighs, right worse than left.  No bowel or bladder dysfunction, saddle numbness, constitutional symptoms, trauma.  I reviewed the past medical history, family history, social history, surgical history, and allergies today and no changes were needed.  Please see the problem list section below in epic for further details.  Past Medical History: Past Medical History:  Diagnosis Date  . DDD (degenerative disc disease), lumbar   . Depression   . IFG (impaired fasting glucose)   . Obesity   . Plantar fasciitis    See's Dr. Ihor Gully  . Thyroid disease   . Tobacco abuse    Past Surgical History: Past Surgical History:  Procedure Laterality Date  . CESAREAN SECTION    . fluroscopy     LESI/Dr. Yves Dill  . TONSILLECTOMY     adenoids, and uvula removed for sleep apnes  . TUBAL LIGATION     Social History: Social History   Socioeconomic History  . Marital status: Married    Spouse name: Not on file  . Number of children: 1  . Years of education: Not on file  . Highest education level: Not on file  Occupational History  . Occupation: REtired [employer] cabin crew   Social Needs  . Financial resource strain: Not on file  . Food insecurity:    Worry: Not on file    Inability: Not on file  . Transportation needs:    Medical: Not on file    Non-medical: Not on file  Tobacco Use  . Smoking status: Former Smoker    Types: Cigarettes    Last attempt to quit: 12/12/2009    Years since quitting: 9.1  . Smokeless tobacco: Never Used  Substance and Sexual Activity  . Alcohol use: Yes    Alcohol/week: 1.0 standard drinks    Types: 1 Glasses of wine per week  . Drug use: No  .  Sexual activity: Not Currently    Partners: Male    Birth control/protection: Post-menopausal    Comment: 2nd marriage,  75 yr old son in prison in [place] and gets out 07, walks daily.  Lifestyle  . Physical activity:    Days per week: Not on file    Minutes per session: Not on file  . Stress: Not on file  Relationships  . Social connections:    Talks on phone: Not on file    Gets together: Not on file    Attends religious service: Not on file    Active member of club or organization: Not on file    Attends meetings of clubs or organizations: Not on file    Relationship status: Not on file  Other Topics Concern  . Not on file  Social History Narrative   Working out regulary 3 times per week.    Family History: Family History  Problem Relation Age of Onset  . Osteoporosis Sister   . Hypertension Father   . Depression Father   . Breast cancer Mother 64       postmenopausal breast CA  . Diabetes Maternal Grandfather    Allergies: Allergies  Allergen Reactions  . Codeine Rash  . Hydrochlorothiazide Other (See Comments)    Hyponatremia   .  Penicillins Rash   Medications: See med rec.  Review of Systems: No fevers, chills, night sweats, weight loss, chest pain, or shortness of breath.   Objective:    General: Well Developed, well nourished, and in no acute distress.  Neuro: Alert and oriented x3, extra-ocular muscles intact, sensation grossly intact.  HEENT: Normocephalic, atraumatic, pupils equal round reactive to light, neck supple, no masses, no lymphadenopathy, thyroid nonpalpable.  Skin: Warm and dry, no rashes. Cardiac: Regular rate and rhythm, no murmurs rubs or gallops, no lower extremity edema.  Respiratory: Clear to auscultation bilaterally. Not using accessory muscles, speaking in full sentences. Back Exam:  Inspection: Unremarkable  Motion: Flexion 45 deg, Extension 45 deg, Side Bending to 45 deg bilaterally,  Rotation to 45 deg bilaterally  SLR  laying: Negative  XSLR laying: Negative  Palpable tenderness: None. FABER: negative. Sensory change: Gross sensation intact to all lumbar and sacral dermatomes.  Reflexes: 2+ at both patellar tendons, 2+ at achilles tendons, Babinski's downgoing.  Strength at foot  Plantar-flexion: 5/5 Dorsi-flexion: 5/5 Eversion: 5/5 Inversion: 5/5  Leg strength  Quad: 5/5 Hamstring: 5/5 Hip flexor: 5/5 Hip abductors: 5/5  Gait unremarkable.  MRI reviewed and shows severe canal central stenosis from L3-L5.  Impression and Recommendations:    Right thigh pain Likely radicular and related to spinal stenosis, now bilateral. Has failed greater than 6 weeks of physician directed conservative measures including treatment before she saw me. Adding 5 days of prednisone, gabapentin. MRI today for interventional planning. ___________________________________________ [name]. Benjamin Stain, M.D., ABFM., CAQSM. Primary Care and Sports Medicine [facility]  Adjunct Professor of Family Medicine  [employer] [facility] [employer]

## 2019-01-31 ENCOUNTER — Encounter: Payer: Self-pay | Admitting: Sports Medicine

## 2019-01-31 ENCOUNTER — Encounter: Payer: Self-pay | Admitting: Family Medicine

## 2019-01-31 NOTE — Telephone Encounter (Signed)
I'm going to ask her PCP to address the blood pressure question.  But certainly prednisone can elevate the blood pressure for a few days as can pain.

## 2019-02-10 ENCOUNTER — Encounter: Payer: Self-pay | Admitting: Sports Medicine

## 2019-02-10 DIAGNOSIS — M48061 Spinal stenosis, lumbar region without neurogenic claudication: Secondary | ICD-10-CM

## 2019-02-11 MED ORDER — TRAMADOL HCL 50 MG PO TABS: 50.0000 mg | ORAL_TABLET | Freq: Three times a day (TID) | ORAL | 0 refills | Status: DC | PRN

## 2019-02-11 NOTE — Assessment & Plan Note (Signed)
Failure of conservative measures. Gabapentin also not effective. Proceeding with bilateral L4-L5 transforaminal epidurals. Also adding a bit of tramadol for breakthrough pain. Return 1 month after the injection to evaluate relief.

## 2019-02-11 NOTE — Telephone Encounter (Signed)
Left VM for Roberta at [facility].

## 2019-02-11 NOTE — Telephone Encounter (Signed)
Epidurals ordered, please contact [facility] for scheduling. 

## 2019-02-16 ENCOUNTER — Encounter: Payer: Self-pay | Admitting: Family Medicine

## 2019-02-19 ENCOUNTER — Ambulatory Visit
Admission: RE | Admit: 2019-02-19 | Discharge: 2019-02-19 | Disposition: A | Discharge status: Home / Self Care | Payer: Medicare Other | Source: Ambulatory Visit | Attending: Sports Medicine | Admitting: Sports Medicine

## 2019-02-19 ENCOUNTER — Other Ambulatory Visit: Payer: Self-pay | Admitting: Sports Medicine

## 2019-02-19 DIAGNOSIS — M48061 Spinal stenosis, lumbar region without neurogenic claudication: Secondary | ICD-10-CM

## 2019-02-19 DIAGNOSIS — M4316 Spondylolisthesis, lumbar region: Secondary | ICD-10-CM | POA: Diagnosis not present

## 2019-02-19 DIAGNOSIS — M79651 Pain in right thigh: Secondary | ICD-10-CM

## 2019-02-19 MED ORDER — AMLODIPINE BESYLATE 10 MG PO TABS: 10.0000 mg | ORAL_TABLET | Freq: Every day | ORAL | 1 refills | Status: DC

## 2019-02-19 MED ORDER — IOPAMIDOL (ISOVUE-M 200) INJECTION 41%
1.0000 mL | Freq: Once | INTRAMUSCULAR | Status: AC
  Administered 2019-02-19: 1 mL via EPIDURAL

## 2019-02-19 MED ORDER — METHYLPREDNISOLONE ACETATE 40 MG/ML INJ SUSP (RADIOLOG
120.0000 mg | Freq: Once | INTRAMUSCULAR | Status: AC
  Administered 2019-02-19: 120 mg via EPIDURAL

## 2019-02-19 NOTE — Discharge Instructions (Signed)

## 2019-02-23 ENCOUNTER — Encounter: Payer: Self-pay | Admitting: Family Medicine

## 2019-02-25 MED ORDER — QUETIAPINE FUMARATE 100 MG PO TABS: ORAL_TABLET | ORAL | 1 refills | Status: DC

## 2019-03-30 ENCOUNTER — Other Ambulatory Visit: Payer: Self-pay | Admitting: Family Medicine

## 2019-04-16 ENCOUNTER — Encounter (INDEPENDENT_AMBULATORY_CARE_PROVIDER_SITE_OTHER): Payer: Medicare Other | Admitting: Sports Medicine

## 2019-04-16 DIAGNOSIS — M48061 Spinal stenosis, lumbar region without neurogenic claudication: Secondary | ICD-10-CM

## 2019-04-16 MED ORDER — CARISOPRODOL 350 MG PO TABS: 350.0000 mg | ORAL_TABLET | Freq: Every day | ORAL | 0 refills | Status: DC | PRN

## 2019-04-16 NOTE — Telephone Encounter (Signed)
I spent 5 total minutes of online digital evaluation and management services. 

## 2019-04-16 NOTE — Assessment & Plan Note (Signed)
Epidural helped to some degree, adding a bit of Adderall per patient request.

## 2019-04-18 ENCOUNTER — Encounter: Payer: Self-pay | Admitting: Sports Medicine

## 2019-04-22 ENCOUNTER — Encounter: Payer: Self-pay | Admitting: Family Medicine

## 2019-04-22 ENCOUNTER — Ambulatory Visit (INDEPENDENT_AMBULATORY_CARE_PROVIDER_SITE_OTHER): Payer: Medicare Other | Admitting: Family Medicine

## 2019-04-22 VITALS — BP 124/75 | HR 81 | Temp 98.9°F | Ht 61.42 in | Wt 140.4 lb

## 2019-04-22 DIAGNOSIS — E039 Hypothyroidism, unspecified: Secondary | ICD-10-CM | POA: Diagnosis not present

## 2019-04-22 DIAGNOSIS — E7849 Other hyperlipidemia: Secondary | ICD-10-CM | POA: Diagnosis not present

## 2019-04-22 DIAGNOSIS — R7301 Impaired fasting glucose: Secondary | ICD-10-CM

## 2019-04-22 DIAGNOSIS — I10 Essential (primary) hypertension: Secondary | ICD-10-CM | POA: Diagnosis not present

## 2019-04-22 NOTE — Progress Notes (Signed)
Virtual Visit via Video Note  I connected with Rebecca Austin on 04/22/19 at 10:30 AM EDT by a video enabled telemedicine application and verified that I am speaking with the correct person using two identifiers.   I discussed the limitations of evaluation and management by telemedicine and the availability of in person appointments. The patient expressed understanding and agreed to proceed.  Subjective:    CC: F/u BP   HPI: Hypertension- Pt denies chest pain, SOB, dizziness, or heart palpitations.  Taking meds as directed w/o problems.  Denies medication side effects.  Has had 3 episodes of low BP.  She felt very sleep suddently when it happened.  She felt hydrated.   Impaired fasting glucose-no increased thirst or urination. No symptoms consistent with hypoglycemia.  Last A1c looks fantastic at 5.5. she has been watching her diet and walking for exercise. Her weight went up a little. She really wants to start swimming again.   Hypothyroidism - Taking medication regularly in the AM away from food and vitamins, etc. No recent change to skin, hair, or energy levels.  Weight actually adjusted her thyroid medication by having her take an extra half of a tab 2 days/week starting last November.  She was supposed to recheck thyroid again in 6 weeks it has now been 6 months.  Past medical history, Surgical history, Family history not pertinant except as noted below, Social history, Allergies, and medications have been entered into the medical record, reviewed, and corrections made.   Review of Systems: No fevers, chills, night sweats, weight loss, chest pain, or shortness of breath.   Objective:    General: Speaking clearly in complete sentences without any shortness of breath.  Alert and oriented x3.  Normal judgment. No apparent acute distress. Well groomed.     Impression and Recommendations:    HTN - Well controlled. Continue current regimen. Follow up in 6 months. Occ BP drops low at  90/50.  It is only happened about 3 times.  Encouraged her to check her pulse when it happens as well.  She is not sure what causes that she typically hydrates well for now she will keep an eye on it she does not want to decrease her medication because overall it looks really good and occasionally it will actually jump up.  IFG - due to check A1C .  10 you to work on healthy diet and try to exercise some she is a little bit limited because she cannot go to the gym and swim which is her normal activity.  She says walking tends to aggravate her back and she is been seeing Dr. Benjamin Stain for that.  Hypothyroidism -due to recheck thyroid level since adjusted her regimen last time.    Hyperlipidemia-last lipid level was quite elevated and we had discussed rechecking that after working on diet and exercise.  Lab Results  Component Value Date   CHOL 256 (H) 10/22/2018   HDL 49 (L) 10/22/2018   LDLCALC 175 (H) 10/22/2018   TRIG 168 (H) 10/22/2018   CHOLHDL 5.2 (H) 10/22/2018       I discussed the assessment and treatment plan with the patient. The patient was provided an opportunity to ask questions and all were answered. The patient agreed with the plan and demonstrated an understanding of the instructions.   The patient was advised to call back or seek an in-person evaluation if the symptoms worsen or if the condition fails to improve as anticipated.   Nani Gasser, MD

## 2019-04-24 DIAGNOSIS — E7849 Other hyperlipidemia: Secondary | ICD-10-CM | POA: Diagnosis not present

## 2019-04-24 DIAGNOSIS — I10 Essential (primary) hypertension: Secondary | ICD-10-CM | POA: Diagnosis not present

## 2019-04-24 DIAGNOSIS — R7301 Impaired fasting glucose: Secondary | ICD-10-CM | POA: Diagnosis not present

## 2019-04-24 DIAGNOSIS — E039 Hypothyroidism, unspecified: Secondary | ICD-10-CM | POA: Diagnosis not present

## 2019-04-25 LAB — COMPLETE METABOLIC PANEL WITH GFR
AG Ratio: 1.9 (calc) (ref 1.0–2.5)
ALT: 9 U/L (ref 6–29)
AST: 16 U/L (ref 10–35)
Albumin: 4.1 g/dL (ref 3.6–5.1)
Alkaline phosphatase (APISO): 44 U/L (ref 37–153)
BUN/Creatinine Ratio: 16 (calc) (ref 6–22)
BUN: 16 mg/dL (ref 7–25)
CO2: 28 mmol/L (ref 20–32)
Calcium: 10.4 mg/dL (ref 8.6–10.4)
Chloride: 106 mmol/L (ref 98–110)
Creat: 1 mg/dL — ABNORMAL HIGH (ref 0.50–0.99)
GFR, Est African American: 68 mL/min/{1.73_m2} (ref >=60)
GFR, Est Non African American: 59 mL/min/{1.73_m2} — ABNORMAL LOW (ref >=60)
Globulin: 2.2 g/dL (calc) (ref 1.9–3.7)
Glucose, Bld: 90 mg/dL (ref 65–99)
Potassium: 4.1 mmol/L (ref 3.5–5.3)
Sodium: 142 mmol/L (ref 135–146)
Total Bilirubin: 0.6 mg/dL (ref 0.2–1.2)
Total Protein: 6.3 g/dL (ref 6.1–8.1)

## 2019-04-25 LAB — LIPID PANEL
Cholesterol: 262 mg/dL — ABNORMAL HIGH (ref <200)
HDL: 58 mg/dL (ref >=50)
LDL Cholesterol (Calc): 177 mg/dL (calc) — ABNORMAL HIGH
Non-HDL Cholesterol (Calc): 204 mg/dL (calc) — ABNORMAL HIGH (ref <130)
Total CHOL/HDL Ratio: 4.5 (calc) (ref <5.0)
Triglycerides: 135 mg/dL (ref <150)

## 2019-04-25 LAB — HEMOGLOBIN A1C
Hgb A1c MFr Bld: 5.3 % of total Hgb (ref <5.7)
Mean Plasma Glucose: 105 (calc)
eAG (mmol/L): 5.8 (calc)

## 2019-04-25 LAB — TSH: TSH: 2.05 mIU/L (ref 0.40–4.50)

## 2019-05-05 ENCOUNTER — Other Ambulatory Visit: Payer: Self-pay | Admitting: Sports Medicine

## 2019-05-05 DIAGNOSIS — M79651 Pain in right thigh: Secondary | ICD-10-CM

## 2019-05-09 ENCOUNTER — Other Ambulatory Visit: Payer: Self-pay | Admitting: Sports Medicine

## 2019-05-09 DIAGNOSIS — M79651 Pain in right thigh: Secondary | ICD-10-CM

## 2019-05-25 ENCOUNTER — Other Ambulatory Visit: Payer: Self-pay | Admitting: Family Medicine

## 2019-05-25 ENCOUNTER — Other Ambulatory Visit: Payer: Self-pay | Admitting: Obstetrics & Gynecology

## 2019-05-25 DIAGNOSIS — N951 Menopausal and female climacteric states: Secondary | ICD-10-CM

## 2019-06-24 ENCOUNTER — Encounter: Payer: Self-pay | Admitting: Family Medicine

## 2019-08-02 ENCOUNTER — Other Ambulatory Visit: Payer: Self-pay | Admitting: Sports Medicine

## 2019-08-02 DIAGNOSIS — M79651 Pain in right thigh: Secondary | ICD-10-CM

## 2019-08-07 ENCOUNTER — Other Ambulatory Visit: Payer: Self-pay | Admitting: Obstetrics & Gynecology

## 2019-08-07 DIAGNOSIS — N951 Menopausal and female climacteric states: Secondary | ICD-10-CM

## 2019-08-16 ENCOUNTER — Encounter: Payer: Self-pay | Admitting: Sports Medicine

## 2019-08-16 DIAGNOSIS — M48061 Spinal stenosis, lumbar region without neurogenic claudication: Secondary | ICD-10-CM

## 2019-08-16 MED ORDER — CARISOPRODOL 350 MG PO TABS: 350.0000 mg | ORAL_TABLET | Freq: Every day | ORAL | 0 refills | Status: DC | PRN

## 2019-08-24 ENCOUNTER — Encounter: Payer: Self-pay | Admitting: Family Medicine

## 2019-09-04 ENCOUNTER — Other Ambulatory Visit: Payer: Self-pay

## 2019-09-04 ENCOUNTER — Ambulatory Visit (INDEPENDENT_AMBULATORY_CARE_PROVIDER_SITE_OTHER): Payer: Medicare Other | Admitting: Family Medicine

## 2019-09-04 DIAGNOSIS — Z23 Encounter for immunization: Secondary | ICD-10-CM

## 2019-09-05 ENCOUNTER — Other Ambulatory Visit: Payer: Self-pay | Admitting: Sports Medicine

## 2019-09-05 DIAGNOSIS — M79651 Pain in right thigh: Secondary | ICD-10-CM

## 2019-10-22 ENCOUNTER — Other Ambulatory Visit: Payer: Self-pay | Admitting: Sports Medicine

## 2019-10-22 DIAGNOSIS — M79651 Pain in right thigh: Secondary | ICD-10-CM

## 2019-10-22 MED ORDER — GABAPENTIN 300 MG PO CAPS: 300.0000 mg | ORAL_CAPSULE | Freq: Three times a day (TID) | ORAL | 2 refills | Status: DC

## 2019-10-23 ENCOUNTER — Encounter: Payer: Self-pay | Admitting: Sports Medicine

## 2019-10-23 ENCOUNTER — Other Ambulatory Visit: Payer: Self-pay | Admitting: Family Medicine

## 2019-10-25 ENCOUNTER — Encounter: Payer: Self-pay | Admitting: Sports Medicine

## 2019-10-25 DIAGNOSIS — M48061 Spinal stenosis, lumbar region without neurogenic claudication: Secondary | ICD-10-CM

## 2019-10-25 MED ORDER — GABAPENTIN 600 MG PO TABS: 1200.0000 mg | ORAL_TABLET | Freq: Three times a day (TID) | ORAL | 3 refills | Status: DC

## 2019-10-25 NOTE — Telephone Encounter (Signed)
Need to cut back or OK to send new RX with higher dose

## 2019-10-26 ENCOUNTER — Encounter: Payer: Self-pay | Admitting: Family Medicine

## 2019-11-02 ENCOUNTER — Other Ambulatory Visit: Payer: Self-pay | Admitting: Obstetrics & Gynecology

## 2019-11-02 ENCOUNTER — Other Ambulatory Visit: Payer: Self-pay | Admitting: Family Medicine

## 2019-11-02 DIAGNOSIS — N951 Menopausal and female climacteric states: Secondary | ICD-10-CM

## 2019-11-06 ENCOUNTER — Other Ambulatory Visit: Payer: Self-pay

## 2019-11-06 ENCOUNTER — Other Ambulatory Visit: Payer: Self-pay | Admitting: *Deleted

## 2019-11-06 DIAGNOSIS — N951 Menopausal and female climacteric states: Secondary | ICD-10-CM

## 2019-11-06 MED ORDER — ESTRADIOL 1 MG PO TABS: 1.0000 mg | ORAL_TABLET | Freq: Two times a day (BID) | ORAL | 0 refills | Status: DC

## 2019-11-06 MED ORDER — MEDROXYPROGESTERONE ACETATE 2.5 MG PO TABS: ORAL_TABLET | ORAL | 2 refills | Status: AC

## 2019-11-23 ENCOUNTER — Other Ambulatory Visit: Payer: Self-pay | Admitting: Family Medicine

## 2019-11-26 ENCOUNTER — Telehealth: Payer: Self-pay | Admitting: Family Medicine

## 2019-11-26 NOTE — Telephone Encounter (Signed)
Please schedule for f/u for ifg and bp TY per miss tonya.

## 2019-11-26 NOTE — Telephone Encounter (Signed)
Patient advised and scheduled.  

## 2019-11-28 ENCOUNTER — Other Ambulatory Visit: Payer: Self-pay

## 2019-11-28 ENCOUNTER — Ambulatory Visit (INDEPENDENT_AMBULATORY_CARE_PROVIDER_SITE_OTHER): Payer: Medicare Other | Admitting: Family Medicine

## 2019-11-28 ENCOUNTER — Encounter: Payer: Self-pay | Admitting: Family Medicine

## 2019-11-28 VITALS — BP 134/50 | HR 61 | Ht 61.0 in | Wt 148.0 lb

## 2019-11-28 DIAGNOSIS — R7301 Impaired fasting glucose: Secondary | ICD-10-CM | POA: Diagnosis not present

## 2019-11-28 DIAGNOSIS — Z23 Encounter for immunization: Secondary | ICD-10-CM | POA: Diagnosis not present

## 2019-11-28 DIAGNOSIS — I10 Essential (primary) hypertension: Secondary | ICD-10-CM

## 2019-11-28 DIAGNOSIS — E039 Hypothyroidism, unspecified: Secondary | ICD-10-CM | POA: Diagnosis not present

## 2019-11-28 LAB — POCT GLYCOSYLATED HEMOGLOBIN (HGB A1C): Hemoglobin A1C: 5.2 % (ref 4.0–5.6)

## 2019-11-28 MED ORDER — ZOSTER VAC RECOMB ADJUVANTED 50 MCG/0.5ML IM SUSR: 0.5000 mL | Freq: Once | INTRAMUSCULAR | 0 refills | Status: AC

## 2019-11-28 NOTE — Progress Notes (Signed)
Established Patient Office Visit  Subjective:  Patient ID: Rebecca Austin Ledo, female    DOB: 11-13-1951  Age: 68 y.o. MRN: [id number]  CC:  Chief Complaint  Patient presents with  . Diabetes  . abnormal glucose    HPI Rebecca Austin Rosenau presents for   Hypertension- Pt denies chest pain, SOB, dizziness, or heart palpitations.  Taking meds as directed w/o problems.  Denies medication side effects.   Impaired fasting glucose-no increased thirst or urination. No symptoms consistent with hypoglycemia.  Hypothyroidism - Taking medication regularly in the AM away from food and vitamins, etc. No recent change to skin, hair, or energy levels. She has gained Austin little weight but feels like that is more related to not exercising quite as consistently because of her back.  She has degenerative disc disease of the lumbar spine.  Mood has actually been really good with Covid.  She said she actually likes staying at home.  And she even converted her bonus room into Austin mini gym.   Past Medical History:  Diagnosis Date  . DDD (degenerative disc disease), lumbar   . Depression   . IFG (impaired fasting glucose)   . Obesity   . Plantar fasciitis    See's Dr. Ihor [place]ully[place]gerstaff  . Thyroid disease   . Tobacco abuse     Past Surgical History:  Procedure Laterality Date  . CESAREAN SECTION    . fluroscopy     LESI/Dr. Yves Dillhasnis  . TONSILLECTOMY     adenoids, and uvula removed for sleep apnes  . TUBAL LIGATION      Family History  Problem Relation Age of Onset  . Osteoporosis Sister   . Hypertension Father   . Depression Father   . Breast cancer Mother [id number]       postmenopausal breast CA  . Diabetes Maternal Grandfather     Social History   Socioeconomic History  . Marital status: Married    Spouse name: Not on file  . Number of children: 1  . Years of education: Not on file  . Highest education level: Not on file  Occupational History  . Occupation: REtired Air cabin crewBusiness analyst   Tobacco Use   . Smoking status: Former Smoker    Types: Cigarettes    Quit date: 12/12/2009    Years since quitting: 9.9  . Smokeless tobacco: Never Used  Substance and Sexual Activity  . Alcohol use: Yes    Alcohol/week: 1.0 standard drinks    Types: 1 Glasses of wine per week  . Drug use: No  . Sexual activity: Not Currently    Partners: Male    Birth control/protection: Post-menopausal    Comment: 2nd marriage,  66 yr old son in prison in [place] and gets out 07, walks daily.  Other Topics Concern  . Not on file  Social History Narrative   Working out regulary 3 times per week.    Social Determinants of Health   Financial Resource Strain:   . Difficulty of Paying Living Expenses: Not on file  Food Insecurity:   . Worried About Programme researcher, broadcasting/film/videounning Out of Food in the Last Year: Not on file  . Ran Out of Food in the Last Year: Not on file  Transportation Needs:   . Lack of Transportation (Medical): Not on file  . Lack of Transportation (Non-Medical): Not on file  Physical Activity:   . Days of Exercise per Week: Not on file  . Minutes of Exercise per Session: Not on file  Stress:   . Feeling of Stress : Not on file  Social Connections:   . Frequency of Communication with Friends and Family: Not on file  . Frequency of Social Gatherings with Friends and Family: Not on file  . Attends Religious Services: Not on file  . Active Member of Clubs or Organizations: Not on file  . Attends Banker Meetings: Not on file  . Marital Status: Not on file  Intimate Partner Violence:   . Fear of Current or Ex-Partner: Not on file  . Emotionally Abused: Not on file  . Physically Abused: Not on file  . Sexually Abused: Not on file    Outpatient Medications Prior to Visit  Medication Sig Dispense Refill  . amLODipine (NORVASC) 10 MG tablet TAKE 1 TABLET DAILY 90 tablet 1  . Calcium Citrate-Vitamin D (CALCIUM CITRATE + D) 250-200 MG-UNIT TABS Take 2 tablets by mouth 2 (two) times daily.    .  carisoprodol (SOMA) 350 MG tablet Take 1 tablet (350 mg total) by mouth daily as needed for muscle spasms. 60 tablet 0  . estradiol (ESTRACE) 1 MG tablet TAKE 1 TABLET TWICE Austin DAY 180 tablet 0  . gabapentin (NEURONTIN) 600 MG tablet Take 2 tablets (1,200 mg total) by mouth 3 (three) times daily. 540 tablet 3  . levothyroxine (SYNTHROID) 50 MCG tablet TAKE 1 TABLET DAILY 90 tablet 1  . losartan (COZAAR) 100 MG tablet TAKE 1 TABLET DAILY 90 tablet 1  . medroxyPROGESTERone (PROVERA) 2.5 MG tablet TAKE 1 TABLET BY MOUTH  DAILY 90 tablet 2  . Multiple Vitamins-Minerals (PRESERVISION AREDS PO) Take by mouth.    . naproxen (NAPROSYN) 500 MG tablet TAKE 1 TABLET BY MOUTH 2 TIMES DAILY WITH Austin MEAL. 180 tablet 1  . omeprazole (PRILOSEC) 20 MG capsule TAKE 1 CAPSULE DAILY 90 capsule 3  . estradiol (ESTRACE) 1 MG tablet Take 1 tablet (1 mg total) by mouth 2 (two) times daily. 180 tablet 0  . fluticasone (FLONASE) 50 MCG/ACT nasal spray USE 2 SPRAYS IN BOTH NOSTRILS DAILY 48 g 3   No facility-administered medications prior to visit.    Allergies  Allergen Reactions  . Codeine Rash  . Hydrochlorothiazide Other (See Comments)    Hyponatremia   . Penicillins Rash    ROS Review of Systems    Objective:    Physical Exam  Constitutional: She is oriented to person, place, and time. She appears well-developed and well-nourished.  HENT:  Head: Normocephalic and atraumatic.  Cardiovascular: Normal rate, regular rhythm and normal heart sounds.  Radial pulse 2+   Pulmonary/Chest: Effort normal and breath sounds normal.  Neurological: She is alert and oriented to person, place, and time.  Skin: Skin is warm and dry.  Psychiatric: She has Austin normal mood and affect. Her behavior is normal.    BP (!) 134/50   Pulse 61   Ht 5\' 1"  (1.549 m)   Wt 148 lb (67.1 kg)   SpO2 100%   BMI 27.96 kg/m  Wt Readings from Last 3 Encounters:  11/28/19 148 lb (67.1 kg)  04/22/19 140 lb 6.4 oz (63.7 kg)  01/28/19  139 lb (63 kg)     There are no preventive care reminders to display for this patient.  There are no preventive care reminders to display for this patient.  Lab Results  Component Value Date   TSH 2.05 04/24/2019   Lab Results  Component Value Date   WBC 5.7 07/14/2011   HGB  13.0 07/14/2011   HCT 40.1 07/14/2011   MCV 87.4 07/14/2011   PLT 244 07/14/2011   Lab Results  Component Value Date   NA 142 04/24/2019   K 4.1 04/24/2019   CO2 28 04/24/2019   GLUCOSE 90 04/24/2019   BUN 16 04/24/2019   CREATININE 1.00 (H) 04/24/2019   BILITOT 0.6 04/24/2019   ALKPHOS 60 07/21/2016   AST 16 04/24/2019   ALT 9 04/24/2019   PROT 6.3 04/24/2019   ALBUMIN 4.1 07/21/2016   CALCIUM 10.4 04/24/2019   Lab Results  Component Value Date   CHOL 262 (H) 04/24/2019   Lab Results  Component Value Date   HDL 58 04/24/2019   Lab Results  Component Value Date   LDLCALC 177 (H) 04/24/2019   Lab Results  Component Value Date   TRIG 135 04/24/2019   Lab Results  Component Value Date   CHOLHDL 4.5 04/24/2019   Lab Results  Component Value Date   HGBA1C 5.2 11/28/2019      Assessment & Plan:   Problem List Items Addressed This Visit      Cardiovascular and Mediastinum   HYPERTENSION, BENIGN SYSTEMIC - Primary    Well controlled. Continue current regimen. Follow up in  21mo      Relevant Orders   TSH   BMP w/ GFR     Endocrine   IMPAIRED FASTING GLUCOSE    Looks great. Check a1c yearly.        Relevant Orders   POCT HgB A1C (Completed)   Hypothyroid    Asymptomatic.  Due to repeat TSH.      Relevant Orders   TSH      Discussed getting the new shingles vaccine.  Patient declined getting Austin mammogram.    Meds ordered this encounter  Medications  . Zoster Vaccine Adjuvanted [facility]) injection    Sig: Inject 0.5 mLs into the muscle once for 1 dose.    Dispense:  0.5 mL    Refill:  0    Follow-up: Return in about 6 months (around 05/28/2020) for  Hypertension.    Beatrice Lecher, MD

## 2019-11-28 NOTE — Assessment & Plan Note (Signed)
Asymptomatic.  Due to repeat TSH.

## 2019-11-28 NOTE — Assessment & Plan Note (Signed)
Well controlled. Continue current regimen. Follow up in  6 mo  

## 2019-11-28 NOTE — Assessment & Plan Note (Signed)
Looks great. Check a1c yearly.

## 2019-11-29 LAB — BASIC METABOLIC PANEL WITH GFR
BUN: 14 mg/dL (ref 7–25)
CO2: 23 mmol/L (ref 20–32)
Calcium: 8.8 mg/dL (ref 8.6–10.4)
Chloride: 108 mmol/L (ref 98–110)
Creat: 0.88 mg/dL (ref 0.50–0.99)
GFR, Est African American: 79 mL/min/{1.73_m2} (ref >=60)
GFR, Est Non African American: 68 mL/min/{1.73_m2} (ref >=60)
Glucose, Bld: 75 mg/dL (ref 65–99)
Potassium: 3.9 mmol/L (ref 3.5–5.3)
Sodium: 143 mmol/L (ref 135–146)

## 2019-11-29 LAB — TSH: TSH: 2.41 mIU/L (ref 0.40–4.50)

## 2019-12-03 ENCOUNTER — Encounter: Payer: Self-pay | Admitting: Family Medicine

## 2019-12-07 ENCOUNTER — Encounter: Payer: Self-pay | Admitting: Family Medicine

## 2019-12-30 ENCOUNTER — Other Ambulatory Visit: Payer: Self-pay

## 2019-12-30 DIAGNOSIS — M48061 Spinal stenosis, lumbar region without neurogenic claudication: Secondary | ICD-10-CM

## 2019-12-31 MED ORDER — CARISOPRODOL 350 MG PO TABS: 350.0000 mg | ORAL_TABLET | Freq: Every day | ORAL | 0 refills | Status: DC | PRN

## 2020-01-06 ENCOUNTER — Encounter: Payer: Self-pay | Admitting: Family Medicine

## 2020-02-01 ENCOUNTER — Ambulatory Visit: Payer: Medicare Other | Attending: Internal Medicine

## 2020-02-01 ENCOUNTER — Other Ambulatory Visit: Payer: Self-pay | Admitting: Family Medicine

## 2020-02-01 DIAGNOSIS — Z23 Encounter for immunization: Secondary | ICD-10-CM | POA: Insufficient documentation

## 2020-02-01 NOTE — Progress Notes (Signed)
   Covid-19 Vaccination Clinic  Name:  Rebecca Austin    MRN: [id number] DOB: Oct 09, 1951  02/01/2020  [name] was observed post Covid-19 immunization for 15 minutes without incidence. She was provided with Vaccine Information Sheet and instruction to access the V-Safe system.   [name] was instructed to call 911 with any severe reactions post vaccine: Marland Kitchen Difficulty breathing  . Swelling of your face and throat  . A fast heartbeat  . A bad rash all over your body  . Dizziness and weakness    Immunizations Administered    Name Date Dose VIS Date Route   Pfizer COVID-19 Vaccine 02/01/2020 10:45 AM 0.3 mL 11/22/2019 Intramuscular   Manufacturer: [employer], [employer]   Lot: [id number]   NDC: [address]-2

## 2020-02-24 ENCOUNTER — Ambulatory Visit: Payer: Medicare Other | Attending: Internal Medicine

## 2020-02-24 DIAGNOSIS — Z23 Encounter for immunization: Secondary | ICD-10-CM

## 2020-02-24 NOTE — Progress Notes (Signed)
   Covid-19 Vaccination Clinic  Name:  Rebecca Austin    MRN: [id number] DOB: [birth year]  02/24/2020  Rebecca Austin was observed post Covid-19 immunization for 15 minutes without incident. She was provided with Vaccine Information Sheet and instruction to access the V-Safe system.   Rebecca Austin was instructed to call 911 with any severe reactions post vaccine: Marland Kitchen Difficulty breathing  . Swelling of face and throat  . A fast heartbeat  . A bad rash all over body  . Dizziness and weakness   Immunizations Administered    Name Date Dose VIS Date Route   Pfizer COVID-19 Vaccine 02/24/2020 10:34 AM 0.3 mL 11/22/2019 Intramuscular   Manufacturer: [employer], [employer]   Lot: [id number]   NDC: [address]-8

## 2020-02-29 ENCOUNTER — Encounter: Payer: Self-pay | Admitting: Family Medicine

## 2020-02-29 ENCOUNTER — Other Ambulatory Visit: Payer: Self-pay | Admitting: Obstetrics & Gynecology

## 2020-02-29 DIAGNOSIS — N951 Menopausal and female climacteric states: Secondary | ICD-10-CM

## 2020-03-02 ENCOUNTER — Other Ambulatory Visit: Payer: Self-pay

## 2020-03-02 MED ORDER — AMLODIPINE BESYLATE 10 MG PO TABS: 10.0000 mg | ORAL_TABLET | Freq: Every day | ORAL | 1 refills | Status: DC

## 2020-03-11 ENCOUNTER — Encounter: Payer: Self-pay | Admitting: Family Medicine

## 2020-03-12 MED ORDER — OMEPRAZOLE 40 MG PO CPDR: 40.0000 mg | DELAYED_RELEASE_CAPSULE | Freq: Every day | ORAL | 1 refills | Status: DC

## 2020-03-12 NOTE — Telephone Encounter (Signed)
Rebecca Austin, in regards to the omeprazole as an over new prescription for the 40 mg tab for her to start and try.  But I would like for her to schedule follow-up in a couple weeks so that we can make sure that it is helping because if she still having breakthrough symptoms at that point we really do need to evaluate this further.  In regards to the hormone therapy I do not mind taking those over but she does have to continue to at least get mammograms.  I require my patients for him my right hormone therapy to get regular neck mammograms especially if they are on any type of estrogen product.  Breast cancer is a risk factor and so we have to monitor carefully for that if we are going to continue to use them.  More than happy to discuss further with her at her appointment.  If she does need refills for the short-term then we can go ahead and fill those now.

## 2020-03-12 NOTE — Telephone Encounter (Signed)
Please advise on medication change/taking over medications. I have declined HM for pap/mammogram since patient has declined to continue with these.

## 2020-03-13 ENCOUNTER — Other Ambulatory Visit: Payer: Self-pay | Admitting: Family Medicine

## 2020-03-21 ENCOUNTER — Encounter: Payer: Self-pay | Admitting: Family Medicine

## 2020-03-23 ENCOUNTER — Other Ambulatory Visit: Payer: Self-pay | Admitting: *Deleted

## 2020-03-23 ENCOUNTER — Other Ambulatory Visit: Payer: Self-pay

## 2020-03-23 MED ORDER — OMEPRAZOLE 40 MG PO CPDR: DELAYED_RELEASE_CAPSULE | ORAL | 1 refills | Status: DC

## 2020-03-23 NOTE — Progress Notes (Signed)
ERR

## 2020-04-04 ENCOUNTER — Other Ambulatory Visit: Payer: Self-pay | Admitting: Sports Medicine

## 2020-04-04 DIAGNOSIS — M79651 Pain in right thigh: Secondary | ICD-10-CM

## 2020-04-27 ENCOUNTER — Encounter: Payer: Self-pay | Admitting: Family Medicine

## 2020-04-28 NOTE — Telephone Encounter (Signed)
Sent pt a note that appt is needed

## 2020-05-03 ENCOUNTER — Other Ambulatory Visit: Payer: Self-pay

## 2020-05-03 DIAGNOSIS — M48061 Spinal stenosis, lumbar region without neurogenic claudication: Secondary | ICD-10-CM

## 2020-05-06 ENCOUNTER — Encounter: Payer: Self-pay | Admitting: Family Medicine

## 2020-05-12 ENCOUNTER — Telehealth: Payer: Self-pay | Admitting: Family Medicine

## 2020-05-12 NOTE — Telephone Encounter (Signed)
Please call patient.  Patient thought she had an appointment on June 7 but I do not see anything on the books so I am not sure if she did and then canceled it we may need to call her and figure out what is going on.  She will need a longer appointment time as we may actually need to do a toenail removal that day

## 2020-05-12 NOTE — Telephone Encounter (Signed)
LVM informing patient about appt on the 7th at 10:30. Gave her my direct extension if she had any questions.

## 2020-05-13 ENCOUNTER — Other Ambulatory Visit: Payer: Self-pay

## 2020-05-13 DIAGNOSIS — M48061 Spinal stenosis, lumbar region without neurogenic claudication: Secondary | ICD-10-CM

## 2020-05-13 MED ORDER — CARISOPRODOL 350 MG PO TABS: 350.0000 mg | ORAL_TABLET | Freq: Every day | ORAL | 0 refills | Status: DC | PRN

## 2020-05-13 NOTE — Telephone Encounter (Signed)
Pt is scheduled for an OV on 05/20/2020 and is out of carisoprodol 350 mg. Pt is requesting a short supply to bridge her to her appt. Rx has been tee'd up below and is ready for review and approval/denial

## 2020-05-16 ENCOUNTER — Encounter: Payer: Self-pay | Admitting: Family Medicine

## 2020-05-18 ENCOUNTER — Encounter: Payer: Self-pay | Admitting: Family Medicine

## 2020-05-18 ENCOUNTER — Ambulatory Visit (INDEPENDENT_AMBULATORY_CARE_PROVIDER_SITE_OTHER): Payer: Medicare Other | Admitting: Family Medicine

## 2020-05-18 VITALS — BP 135/67 | HR 57 | Ht 61.0 in | Wt 144.0 lb

## 2020-05-18 DIAGNOSIS — B079 Viral wart, unspecified: Secondary | ICD-10-CM | POA: Diagnosis not present

## 2020-05-18 DIAGNOSIS — M67432 Ganglion, left wrist: Secondary | ICD-10-CM | POA: Diagnosis not present

## 2020-05-18 DIAGNOSIS — R1011 Right upper quadrant pain: Secondary | ICD-10-CM

## 2020-05-18 DIAGNOSIS — R1013 Epigastric pain: Secondary | ICD-10-CM

## 2020-05-18 DIAGNOSIS — R110 Nausea: Secondary | ICD-10-CM

## 2020-05-18 DIAGNOSIS — L600 Ingrowing nail: Secondary | ICD-10-CM | POA: Diagnosis not present

## 2020-05-18 NOTE — Progress Notes (Signed)
Established Patient Office Visit  Subjective:  Patient ID: Rebecca Austin, female    DOB: 1951/11/24  Age: 69 y.o. MRN: [id number]  CC:  Chief Complaint  Patient presents with  . Ganglion Cyst    L wrist  . Gastroesophageal Reflux  . Ingrown Toenail    R 2nd toe\    HPI [name] presents for  To look at ganglion cyst on her left wrist. X 1 mo. Has had before on her right wrist.    Follow up GERD-more recently she had a couple weeks where she felt extremely nauseated almost daily before she went out of town to [place] she thought maybe it was just nervous.  But she also had a couple episodes of more intense epigastric and even right upper quadrant pain on and off.  She says in the last week or 2 is actually been better.  Bowels have been moving normally.  Ingrown toenail on the right foot, second toe.  He says the nail has been deformed for years and she feels like it is actually pinching her skin and would like to have it permanently removed if possible.   Past Medical History:  Diagnosis Date  . DDD (degenerative disc disease), lumbar   . Depression   . IFG (impaired fasting glucose)   . Obesity   . Plantar fasciitis    See's Dr. Ihor Gully  . Thyroid disease   . Tobacco abuse     Past Surgical History:  Procedure Laterality Date  . CESAREAN SECTION    . fluroscopy     LESI/Dr. Yves Dill  . TONSILLECTOMY     adenoids, and uvula removed for sleep apnes  . TUBAL LIGATION      Family History  Problem Relation Age of Onset  . Osteoporosis Sister   . Hypertension Father   . Depression Father   . Breast cancer Mother 95       postmenopausal breast CA  . Diabetes Maternal Grandfather     Social History   Socioeconomic History  . Marital status: Married    Spouse name: Not on file  . Number of children: 1  . Years of education: Not on file  . Highest education level: Not on file  Occupational History  . Occupation: REtired Air cabin crew    Tobacco Use  . Smoking status: Former Smoker    Types: Cigarettes    Quit date: 12/12/2009    Years since quitting: 10.4  . Smokeless tobacco: Never Used  Substance and Sexual Activity  . Alcohol use: Yes    Alcohol/week: 1.0 standard drinks    Types: 1 Glasses of wine per week  . Drug use: No  . Sexual activity: Not Currently    Partners: Male    Birth control/protection: Post-menopausal    Comment: 2nd marriage,  43 yr old son in prison in [place] and gets out 07, walks daily.  Other Topics Concern  . Not on file  Social History Narrative   Working out regulary 3 times per week.    Social Determinants of Health   Financial Resource Strain:   . Difficulty of Paying Living Expenses:   Food Insecurity:   . Worried About Programme researcher, broadcasting/film/video in the Last Year:   . Barista in the Last Year:   Transportation Needs:   . Freight forwarder (Medical):   Marland Kitchen Lack of Transportation (Non-Medical):   Physical Activity:   . Days of Exercise per Week:   .  Minutes of Exercise per Session:   Stress:   . Feeling of Stress :   Social Connections:   . Frequency of Communication with Friends and Family:   . Frequency of Social Gatherings with Friends and Family:   . Attends Religious Services:   . Active Member of Clubs or Organizations:   . Attends Archivist Meetings:   Marland Kitchen Marital Status:   Intimate Partner Violence:   . Fear of Current or Ex-Partner:   . Emotionally Abused:   Marland Kitchen Physically Abused:   . Sexually Abused:     Outpatient Medications Prior to Visit  Medication Sig Dispense Refill  . amLODipine (NORVASC) 10 MG tablet Take 1 tablet (10 mg total) by mouth daily. 90 tablet 1  . Calcium Citrate-Vitamin D (CALCIUM CITRATE + D) 250-200 MG-UNIT TABS Take 2 tablets by mouth 2 (two) times daily.    . carisoprodol (SOMA) 350 MG tablet Take 1 tablet (350 mg total) by mouth daily as needed for muscle spasms. 14 tablet 0  . estradiol (ESTRACE) 1 MG tablet TAKE 1  TABLET TWICE A DAY 180 tablet 0  . gabapentin (NEURONTIN) 600 MG tablet Take 2 tablets (1,200 mg total) by mouth 3 (three) times daily. 540 tablet 3  . levothyroxine (SYNTHROID) 50 MCG tablet TAKE 1 TABLET DAILY 90 tablet 1  . losartan (COZAAR) 100 MG tablet TAKE 1 TABLET DAILY 90 tablet 1  . medroxyPROGESTERone (PROVERA) 2.5 MG tablet TAKE 1 TABLET BY MOUTH  DAILY 90 tablet 2  . Multiple Vitamins-Minerals (PRESERVISION AREDS PO) Take by mouth.    . naproxen (NAPROSYN) 500 MG tablet TAKE 1 TABLET BY MOUTH 2 TIMES DAILY WITH A MEAL. 180 tablet 1  . omeprazole (PRILOSEC) 40 MG capsule TAKE 1 CAPSULE BY MOUTH EVERY DAY 90 capsule 1   No facility-administered medications prior to visit.    Allergies  Allergen Reactions  . Codeine Rash  . Hydrochlorothiazide Other (See Comments)    Hyponatremia   . Penicillins Rash    ROS Review of Systems    Objective:    Physical Exam  Constitutional: She is oriented to person, place, and time. She appears well-developed and well-nourished.  HENT:  Head: Normocephalic and atraumatic.  Eyes: Conjunctivae and EOM are normal.  Cardiovascular: Normal rate.  Pulmonary/Chest: Effort normal.  Neurological: She is alert and oriented to person, place, and time.  Skin: Skin is dry. No pallor.   2 cm dorsal ganglion cyst on the left wrist.  Firm but somewhat soft.  No surrounding erythema.  Second nail on the right foot actually comes up in almost a V shape in the center with some thickening hypertrophy.  Psychiatric: She has a normal mood and affect. Her behavior is normal.  Vitals reviewed.   BP 135/67   Pulse (!) 57   Ht 5\' 1"  (1.549 m)   Wt 144 lb (65.3 kg)   SpO2 100%   BMI 27.21 kg/m  Wt Readings from Last 3 Encounters:  05/18/20 144 lb (65.3 kg)  11/28/19 148 lb (67.1 kg)  04/22/19 140 lb 6.4 oz (63.7 kg)     There are no preventive care reminders to display for this patient.  There are no preventive care reminders to display for  this patient.  Lab Results  Component Value Date   TSH 2.41 11/28/2019   Lab Results  Component Value Date   WBC 5.7 07/14/2011   HGB 13.0 07/14/2011   HCT 40.1 07/14/2011   MCV 87.4  07/14/2011   PLT 244 07/14/2011   Lab Results  Component Value Date   NA 143 11/28/2019   K 3.9 11/28/2019   CO2 23 11/28/2019   GLUCOSE 75 11/28/2019   BUN 14 11/28/2019   CREATININE 0.88 11/28/2019   BILITOT 0.6 04/24/2019   ALKPHOS 60 07/21/2016   AST 16 04/24/2019   ALT 9 04/24/2019   PROT 6.3 04/24/2019   ALBUMIN 4.1 07/21/2016   CALCIUM 8.8 11/28/2019   Lab Results  Component Value Date   CHOL 262 (H) 04/24/2019   Lab Results  Component Value Date   HDL 58 04/24/2019   Lab Results  Component Value Date   LDLCALC 177 (H) 04/24/2019   Lab Results  Component Value Date   TRIG 135 04/24/2019   Lab Results  Component Value Date   CHOLHDL 4.5 04/24/2019   Lab Results  Component Value Date   HGBA1C 5.2 11/28/2019      Assessment & Plan:   Problem List Items Addressed This Visit    None    Visit Diagnoses    RUQ pain    -  Primary   Relevant Orders   US Abdomen Complete   Epigastric pain       Nausea       Ingrown nail of second toe of right foot       Ganglion cyst of wrist, left       Viral warts, unspecified type         Right upper quadrant such epigastric pain-she is feeling better this week but I do think we should take a look at her gallbladder specially along with the nausea.  He is already on omeprazole 40 mg and takes it pretty regularly.  Ingrown nail-discussed options including removal.  Patient would like to have it permanently removed if at all possible.   No orders of the defined types were placed in this encounter.   Follow-up: Return in about 10 days (around 05/28/2020) for recheck toe and wrist. .    Intralesional Injection Procedure Note Diagnosis: Left dorsal ganglion cyst Location: same Informed Consent: Discussed risks (infection,  pain, bleeding, bruising, thinning of the skin, loss of skin pigment, lack of resolution, and recurrence of lesion) and benefits of the procedure, as well as the alternatives. Informed consent was obtained. Preparation: The area was prepared a standard fashion, with chlorhexidine. Anesthesia: ethyl chloride spray Procedure Details: 18 g needle used to try to aspiratee the lesion. No fluid aspirated.  Attempted to use slightly smaller needle and again unable to aspirate the lesion.  Patient tolerated procedure well.  Kenalog 40 mg/cc. 1 cc in total were injected into the area. Plan: The patient was instructed on post-op care. Recommend OTC analgesia as needed for pain.  If lesion not resolving over the next couple of weeks then recommend getting her in with Dr. Benjamin Stain to take a look at the lesion with ultrasound.    Toenail Avulsion Procedure Note  Pre-operative Diagnosis: Right Ingrown 2ndtoenail   Post-operative Diagnosis: same  Indications: pain  Anesthesia: Lidocaine 1% without epinephrine without added sodium bicarbonate  Procedure Details  History of allergy to iodine: no  The risks (including bleeding and infection) and benefits of the  procedure and Verbal informed consent obtained.  After digital block anesthesia was obtained, a tourniquet was applied for hemostasis during the procedure.  After prepping with chlorhexidine, the nail was completely freed from the nailbed and perionychium with nail lifter and hemostats, phenol was  applied to the nail base.  All visible granulation tissue is debrided. bulky dressing was applied.   Findings: Ingrown nail.   Complications: none.  Plan: 1. Soak the foot twice daily. Change dressing twice daily until healed over. 2. Warning signs of infection were reviewed.   3. Recommended that the patient use OTC acetaminophen as needed for pain.  4. Return PRN.   Nani Gasser, MD   Cryotherapy Procedure Note  Pre-operative  Diagnosis: wart   Post-operative Diagnosis: same  Locations: Volar side of the first finger near the DIP joint.  Indications: same  Anesthesia: none needed   Procedure Details  Patient informed of risks (permanent scarring, infection, light or dark discoloration, bleeding, infection, weakness, numbness and recurrence of the lesion) and benefits of the procedure and verbal informed consent obtained.  The areas are treated with liquid nitrogen therapy, frozen until ice ball extended 1-2 mm beyond lesion, allowed to thaw, and treated again. The patient tolerated procedure well.  The patient was instructed on post-op care, warned that there may be blister formation, redness and pain. Recommend OTC analgesia as needed for pain.  Condition: Stable  Complications: none.  Plan: 1. Instructed to keep the area dry and covered for 24-48h and clean thereafter. 2. Warning signs of infection were reviewed.   3. Recommended that the patient use OTC acetaminophen as needed for pain.  4. Return PRN.

## 2020-05-20 ENCOUNTER — Ambulatory Visit (INDEPENDENT_AMBULATORY_CARE_PROVIDER_SITE_OTHER): Payer: Medicare Other

## 2020-05-20 ENCOUNTER — Ambulatory Visit (INDEPENDENT_AMBULATORY_CARE_PROVIDER_SITE_OTHER): Payer: Medicare Other | Admitting: Sports Medicine

## 2020-05-20 ENCOUNTER — Encounter: Payer: Self-pay | Admitting: Sports Medicine

## 2020-05-20 DIAGNOSIS — M5030 Other cervical disc degeneration, unspecified cervical region: Secondary | ICD-10-CM | POA: Diagnosis not present

## 2020-05-20 DIAGNOSIS — M48061 Spinal stenosis, lumbar region without neurogenic claudication: Secondary | ICD-10-CM

## 2020-05-20 DIAGNOSIS — M542 Cervicalgia: Secondary | ICD-10-CM | POA: Diagnosis not present

## 2020-05-20 MED ORDER — CARISOPRODOL 350 MG PO TABS: 350.0000 mg | ORAL_TABLET | Freq: Every day | ORAL | 0 refills | Status: DC | PRN

## 2020-05-20 NOTE — Assessment & Plan Note (Signed)
This is a pleasant 69 year old female with history of lumbar spinal stenosis, she has lately had some pain in her neck, worse with extension with radiation down both arms, no progressive weakness, constitutional symptoms or trauma. We will start conservatively, formal physical therapy, x-rays, I am happy to refill her medications, return in 4 to 6 weeks, MRI for interventional planning if no better.

## 2020-05-20 NOTE — Progress Notes (Signed)
    Procedures performed today:    None.  Independent interpretation of notes and tests performed by another provider:   None.  Brief History, Exam, Impression, and Recommendations:    DDD (degenerative disc disease), cervical This is a pleasant 69 year old female with history of lumbar spinal stenosis, she has lately had some pain in her neck, worse with extension with radiation down both arms, no progressive weakness, constitutional symptoms or trauma. We will start conservatively, formal physical therapy, x-rays, I am happy to refill her medications, return in 4 to 6 weeks, MRI for interventional planning if no better.  Lumbar spinal stenosis Mild improvement with epidural last year, lumbar spinal stenosis known from L3-L5. Soma, gabapentin, naproxen seems to control this adequately.    ___________________________________________ [name]. Benjamin Stain, M.D., ABFM., CAQSM. Primary Care and Sports Medicine [facility]  Adjunct Instructor of Family Medicine  [employer]

## 2020-05-20 NOTE — Assessment & Plan Note (Signed)
Mild improvement with epidural last year, lumbar spinal stenosis known from L3-L5. Soma, gabapentin, naproxen seems to control this adequately.

## 2020-05-21 ENCOUNTER — Ambulatory Visit (INDEPENDENT_AMBULATORY_CARE_PROVIDER_SITE_OTHER): Payer: Medicare Other

## 2020-05-21 ENCOUNTER — Other Ambulatory Visit: Payer: Self-pay

## 2020-05-21 DIAGNOSIS — K769 Liver disease, unspecified: Secondary | ICD-10-CM

## 2020-05-21 DIAGNOSIS — R1011 Right upper quadrant pain: Secondary | ICD-10-CM

## 2020-05-21 DIAGNOSIS — K8020 Calculus of gallbladder without cholecystitis without obstruction: Secondary | ICD-10-CM | POA: Diagnosis not present

## 2020-05-25 ENCOUNTER — Other Ambulatory Visit: Payer: Self-pay

## 2020-05-25 ENCOUNTER — Ambulatory Visit (INDEPENDENT_AMBULATORY_CARE_PROVIDER_SITE_OTHER): Payer: Medicare Other | Admitting: Rehabilitative and Restorative Service Providers"

## 2020-05-25 ENCOUNTER — Encounter: Payer: Self-pay | Admitting: Family Medicine

## 2020-05-25 ENCOUNTER — Encounter: Payer: Self-pay | Admitting: Rehabilitative and Restorative Service Providers"

## 2020-05-25 DIAGNOSIS — R293 Abnormal posture: Secondary | ICD-10-CM

## 2020-05-25 DIAGNOSIS — R29898 Other symptoms and signs involving the musculoskeletal system: Secondary | ICD-10-CM | POA: Diagnosis not present

## 2020-05-25 DIAGNOSIS — M542 Cervicalgia: Secondary | ICD-10-CM

## 2020-05-25 DIAGNOSIS — M5412 Radiculopathy, cervical region: Secondary | ICD-10-CM | POA: Diagnosis not present

## 2020-05-25 NOTE — Therapy (Signed)
[place] [address] [place], [place], [address] Phone: [phone]   Fax:  [phone]  Physical Therapy Evaluation  Patient Details  Name: Rebecca Austin MRN: [id number] Date of Birth: 05/08/1951 Referring Provider (PT): Dr Dianah Field    Encounter Date: 05/25/2020   PT End of Session - 05/25/20 0942    Visit Number 1    Number of Visits 12    Date for PT Re-Evaluation 07/06/20    PT Start Time 0800    PT Stop Time 0854    PT Time Calculation (min) 54 min    Activity Tolerance Patient tolerated treatment well           Past Medical History:  Diagnosis Date  . DDD (degenerative disc disease), lumbar   . Depression   . IFG (impaired fasting glucose)   . Obesity   . Plantar fasciitis    See's Dr. Jetta Lout  . Thyroid disease   . Tobacco abuse     Past Surgical History:  Procedure Laterality Date  . CESAREAN SECTION    . fluroscopy     LESI/Dr. Sharlet Salina  . TONSILLECTOMY     adenoids, and uvula removed for sleep apnes  . TUBAL LIGATION      There were no vitals filed for this visit.    Subjective Assessment - 05/25/20 0806    Subjective Gradual onset of neck pain and discomfort over the past 3 months with no known injury. Irritated symptoms a month or so ago carrying a backpack through airports with travel. She has neck pain and soreness in the Lt side of the neck. She has some numbness in her arms when she is in certain positions.    Pertinent History C-section 13 yrs ago; arthritis; osteopenia; HTN    Patient Stated Goals learn exercises to get rid of pain    Currently in Pain? Yes    Pain Score 5     Pain Location Neck    Pain Orientation Left;Lateral    Pain Descriptors / Indicators Dull;Aching    Pain Type Acute pain    Pain Onset More than a month ago    Pain Frequency Constant    Aggravating Factors  turning head to Lt; positions in bed; looking up    Pain Relieving Factors ice;  stretching neck              [facility] PT Assessment - 05/25/20 0001      Assessment   Medical Diagnosis Cervical dysfunction    Referring Provider (PT) Dr Dianah Field     Onset Date/Surgical Date 02/10/20    Hand Dominance Right    Next MD Visit PRN     Prior Therapy for LBP       Precautions   Precautions None      Restrictions   Weight Bearing Restrictions No      Balance Screen   Has the patient fallen in the past 6 months No    Has the patient had a decrease in activity level because of a fear of falling?  No    Is the patient reluctant to leave their home because of a fear of falling?  No      Home Ecologist residence    Living Arrangements Spouse/significant other      Prior Function   Level of [place] Retired    Firefighter at desk and  computer retired ~ 2009     Leisure walking daily inside ~ 50 min; household chores; gardening; watching ipad in bed       Observation/Other Assessments   Focus on Therapeutic Outcomes (FOTO)  47% limitation       Sensation   Additional Comments WFL's per pt report       Posture/Postural Control   Posture Comments head forward; shoulders rounded and elevated; head of the humerus anterior in orientation      AROM   Right/Left Shoulder --   tight end ranges Lt > Rt discomfort Lt flex; abd; IR    Cervical Flexion 68    Cervical Extension 53    Cervical - Right Side Bend 39   painful    Cervical - Left Side Bend 35   discomfort; stiffness   Cervical - Right Rotation 65    Cervical - Left Rotation 44   painful      Strength   Overall Strength Comments WFL's except Lt middle trap 5-/5 lower trap 4+/5 discomfort       Palpation   Spinal mobility hypomobile through the thoracic and cervical spine with CPA and lateral mobs Lt > Rt     Palpation comment significant muscular tightness through Lt > Rt ant/lat/posterior cerivcal  musculature; pecs; upper traps; leveator; teres                       Objective measurements completed on examination: See above findings.       [facility] Adult PT Treatment/Exercise - 05/25/20 0001      Neuro Re-ed    Neuro Re-ed Details  working on postural correction engaging posterior shoulder girdle       Shoulder Exercises: Standing   Other Standing Exercises axial extension 10 sec x 5; scap squeeze 10 sec x 5; L's x 10; W's x 10 with noodle       Shoulder Exercises: Stretch   Other Shoulder Stretches pec stretch in doorway 30 sec x 2 reps each position       Moist Heat Therapy   Number Minutes Moist Heat 10 Minutes    Moist Heat Location Shoulder;Cervical      Electrical Stimulation   Electrical Stimulation Location bilat upper trap; mid thoracic     Electrical Stimulation Action TENS    Electrical Stimulation Parameters to tolerance    Electrical Stimulation Goals Pain;Tone                  PT Education - 05/25/20 0846    Education Details HEP POC TENS DN    Person(s) Educated Patient    Methods Explanation;Demonstration;Tactile cues;Verbal cues;Handout    Comprehension Verbalized understanding;Returned demonstration;Verbal cues required;Tactile cues required               PT Long Term Goals - 05/25/20 0953      PT LONG TERM GOAL #1   Title Improve posture and alignment with patient to demonstrate more upright posture with posterior shoulder girdle engaged    Time 6    Period Weeks    Status New    Target Date 07/06/20      PT LONG TERM GOAL #2   Title Decrease frequency, intensity and duration of pain by 75-100% allowing patient to return to normal functional and recreational activities    Time 6    Period Weeks    Status New    Target Date 07/06/20  PT LONG TERM GOAL #3   Title Increase cervical ROM to WFL's and pain free in all planes    Time 6    Period Weeks    Status New    Target Date 07/06/20      PT LONG TERM  GOAL #4   Title Independent in HEP    Time 6    Period Weeks    Status New    Target Date 07/06/20      PT LONG TERM GOAL #5   Title Improve FOTO to </= 37% limitation    Time 6    Period Weeks    Status New    Target Date 07/06/20                  Plan - 05/25/20 0942    Clinical Impression Statement Patient presents with cervical dysfunciton including pain Lt > Rt through the cervical spine and intermittent numbness into bilat UE's. She has poor cervical and thoracic posture and alignment; hypomibility through the thoracic and cervical spine; decreased cervical mobility and ROM; significant muscular tightness thorugh the cervical and shoudler girdle musculature; pain on a constant basis; limited functional and leisure activity tolerance. She will benefit from PT to address problems identified.    Stability/Clinical Decision Making Stable/Uncomplicated    Clinical Decision Making Low    Rehab Potential Good    PT Frequency 2x / week    PT Duration 6 weeks    PT Treatment/Interventions Patient/family education;ADLs/Self Care Home Management;Cryotherapy;Electrical Stimulation;Iontophoresis 4mg /ml Dexamethasone;Moist Heat;Ultrasound;Therapeutic activities;Therapeutic exercise;Neuromuscular re-education;Manual techniques;Dry needling;Taping;Other (comment)    PT Next Visit Plan review HEP; progress with postural correction(prolonged snow angel/neural mobilization/nerve gliding supine); thoracic mobs; manual work vs DN; modalities as indicated    PT Home Exercise Plan [id number]    Consulted and Agree with Plan of Care Patient           Patient will benefit from skilled therapeutic intervention in order to improve the following deficits and impairments:     Visit Diagnosis: Cervicalgia - Plan: PT plan of care cert/re-cert  Radiculopathy, cervical region - Plan: PT plan of care cert/re-cert  Other symptoms and signs involving the musculoskeletal system - Plan: PT plan of care  cert/re-cert  Abnormal posture - Plan: PT plan of care cert/re-cert     Problem List Patient Active Problem List   Diagnosis Date Noted  . DDD (degenerative disc disease), cervical 05/20/2020  . Lumbar spinal stenosis 01/15/2019  . Hypothyroid 10/29/2015  . Breast mass, right 09/29/2015  . Macular degeneration, bilateral 07/28/2015  . Osteopenia 07/28/2015  . Hearing loss 04/02/2013  . Hyponatremia 07/19/2011  . FATIGUE 06/25/2010  . IMPAIRED FASTING GLUCOSE 09/01/2008  . Hyperlipidemia 09/19/2006  . HYPERTENSION, BENIGN SYSTEMIC 09/19/2006  . Symptomatic menopausal or female climacteric states 09/19/2006  . ATOPIC DERMATITIS 09/19/2006  . INSOMNIA NOS 09/19/2006    Huntington Leverich 11/19/2006 PT, MPH  05/25/2020, 9:58 AM  [facility] [address], [place], [place] Phone: [phone]   Fax:  [phone]  Name: Rebecca Austin MRN: [name] Date of Birth: [birth year]

## 2020-05-25 NOTE — Patient Instructions (Signed)
TENS UNIT: This is helpful for muscle pain and spasm.   Search and Purchase a TENS 7000 2nd edition at www.tenspros.com. It should be less than $30.     TENS unit instructions: Do not shower or bathe with the unit on Turn the unit off before removing electrodes or batteries If the electrodes lose stickiness add a drop of water to the electrodes after they are disconnected from the unit and place on plastic sheet. If you continued to have difficulty, call the TENS unit company to purchase more electrodes. Do not apply lotion on the skin area prior to use. Make sure the skin is clean and dry as this will help prolong the life of the electrodes. After use, always check skin for unusual red areas, rash or other skin difficulties. If there are any skin problems, does not apply electrodes to the same area. Never remove the electrodes from the unit by pulling the wires. Do not use the TENS unit or electrodes other than as directed. Do not change electrode placement without consultating your therapist or physician. Keep 2 fingers with between each electrode.   Trigger Point Dry Needling  . What is Trigger Point Dry Needling (DN)? o DN is a physical therapy technique used to treat muscle pain and dysfunction. Specifically, DN helps deactivate muscle trigger points (muscle knots).  o A thin filiform needle is used to penetrate the skin and stimulate the underlying trigger point. The goal is for a local twitch response (LTR) to occur and for the trigger point to relax. No medication of any kind is injected during the procedure.   . What Does Trigger Point Dry Needling Feel Like?  o The procedure feels different for each individual patient. Some patients report that they do not actually feel the needle enter the skin and overall the process is not painful. Very mild bleeding may occur. However, many patients feel a deep cramping in the muscle in which the needle was inserted. This is the local twitch  response.   Marland Kitchen How Will I feel after the treatment? o Soreness is normal, and the onset of soreness may not occur for a few hours. Typically this soreness does not last longer than two days.  o Bruising is uncommon, however; ice can be used to decrease any possible bruising.  o In rare cases feeling tired or nauseous after the treatment is normal. In addition, your symptoms may get worse before they get better, this period will typically not last longer than 24 hours.   . What Can I do After My Treatment? o Increase your hydration by drinking more water for the next 24 hours. o You may place ice or heat on the areas treated that have become sore, however, do not use heat on inflamed or bruised areas. Heat often brings more relief post needling. o You can continue your regular activities, but vigorous activity is not recommended initially after the treatment for 24 hours. o DN is best combined with other physical therapy such as strengthening, stretching, and other therapies.   Access Code: [email or web address]: [email or web address]: 06/14/2021Prepared by: Arah Aro HoltExercises  Seated Cervical Retraction - 2 x daily - 7 x weekly - 1 sets - 5-10 reps - 5-10 sec hold  Seated Scapular Retraction - 2 x daily - 7 x weekly - 1 sets - 10 reps - 10 sec hold  Shoulder External Rotation and Scapular Retraction - 2 x daily - 7 x weekly - 1 sets - 10 reps - 1-2 sec  hold  Shoulder External Rotation in 45 Degrees Abduction - 2 x daily - 7 x weekly - 1 sets - 10 reps - 1-2 sec hold  Doorway Pec Stretch at 60 Degrees Abduction - 3 x daily - 7 x weekly - 3 reps - 1 sets  Doorway Pec Stretch at 90 Degrees Abduction - 3 x daily - 7 x weekly - 3 reps - 1 sets - 30 seconds hold  Doorway Pec Stretch at 120 Degrees Abduction - 3 x daily - 7 x weekly - 3 reps - 1 sets - 30 second hold hold Patient Education  TENS Unit  Trigger Point Dry Needling

## 2020-05-27 ENCOUNTER — Other Ambulatory Visit: Payer: Self-pay

## 2020-05-27 ENCOUNTER — Encounter: Payer: Self-pay | Admitting: Rehabilitative and Restorative Service Providers"

## 2020-05-27 ENCOUNTER — Ambulatory Visit (INDEPENDENT_AMBULATORY_CARE_PROVIDER_SITE_OTHER): Payer: Medicare Other | Admitting: Rehabilitative and Restorative Service Providers"

## 2020-05-27 DIAGNOSIS — R293 Abnormal posture: Secondary | ICD-10-CM

## 2020-05-27 DIAGNOSIS — R29898 Other symptoms and signs involving the musculoskeletal system: Secondary | ICD-10-CM

## 2020-05-27 DIAGNOSIS — M5412 Radiculopathy, cervical region: Secondary | ICD-10-CM | POA: Diagnosis not present

## 2020-05-27 DIAGNOSIS — M542 Cervicalgia: Secondary | ICD-10-CM | POA: Diagnosis not present

## 2020-05-27 NOTE — Patient Instructions (Addendum)
Access Code: [email or web address]: [email or web address]: 06/16/2021Prepared by: Sinclair Alligood HoltExercises  Seated Cervical Retraction - 2 x daily - 7 x weekly - 1 sets - 5-10 reps - 5-10 sec hold  Seated Scapular Retraction - 2 x daily - 7 x weekly - 1 sets - 10 reps - 10 sec hold  Shoulder External Rotation and Scapular Retraction - 2 x daily - 7 x weekly - 1 sets - 10 reps - 1-2 sec hold  Shoulder External Rotation in 45 Degrees Abduction - 2 x daily - 7 x weekly - 1 sets - 10 reps - 1-2 sec hold  Doorway Pec Stretch at 60 Degrees Abduction - 3 x daily - 7 x weekly - 3 reps - 1 sets  Doorway Pec Stretch at 90 Degrees Abduction - 3 x daily - 7 x weekly - 3 reps - 1 sets - 30 seconds hold  Doorway Pec Stretch at 120 Degrees Abduction - 3 x daily - 7 x weekly - 3 reps - 1 sets - 30 second hold hold  Plank on Counter - 2 x daily - 7 x weekly - 1 sets - 3 reps - 30-60 sec hold  Trigger Point Dry Needling  . What is Trigger Point Dry Needling (DN)? o DN is a physical therapy technique used to treat muscle pain and dysfunction. Specifically, DN helps deactivate muscle trigger points (muscle knots).  o A thin filiform needle is used to penetrate the skin and stimulate the underlying trigger point. The goal is for a local twitch response (LTR) to occur and for the trigger point to relax. No medication of any kind is injected during the procedure.   . What Does Trigger Point Dry Needling Feel Like?  o The procedure feels different for each individual patient. Some patients report that they do not actually feel the needle enter the skin and overall the process is not painful. Very mild bleeding may occur. However, many patients feel a deep cramping in the muscle in which the needle was inserted. This is the local twitch response.   Marland Kitchen How Will I feel after the treatment? o Soreness is normal, and the onset of soreness may not occur for a few hours. Typically this soreness does not last longer than two  days.  o Bruising is uncommon, however; ice can be used to decrease any possible bruising.  o In rare cases feeling tired or nauseous after the treatment is normal. In addition, your symptoms may get worse before they get better, this period will typically not last longer than 24 hours.   . What Can I do After My Treatment? o Increase your hydration by drinking more water for the next 24 hours. o You may place ice or heat on the areas treated that have become sore, however, do not use heat on inflamed or bruised areas. Heat often brings more relief post needling. o You can continue your regular activities, but vigorous activity is not recommended initially after the treatment for 24 hours. o DN is best combined with other physical therapy such as strengthening, stretching, and other therapies.

## 2020-05-27 NOTE — Therapy (Signed)
[facility] [place] [address] [place], [place], [address] Phone: [phone]   Fax:  [phone]  Physical Therapy Treatment  Patient Details  Name: Rebecca Austin MRN: [id number] Date of Birth: Nov 23, 1951 Referring Provider (PT): Dr Benjamin Stain    Encounter Date: 05/27/2020   PT End of Session - 05/27/20 0757    Visit Number 2    Number of Visits 12    Date for PT Re-Evaluation 07/06/20    PT Start Time 0756    PT Stop Time 0845    PT Time Calculation (min) 49 min    Activity Tolerance Patient tolerated treatment well           Past Medical History:  Diagnosis Date  . DDD (degenerative disc disease), lumbar   . Depression   . IFG (impaired fasting glucose)   . Obesity   . Plantar fasciitis    See's Dr. Ihor Gully  . Thyroid disease   . Tobacco abuse     Past Surgical History:  Procedure Laterality Date  . CESAREAN SECTION    . fluroscopy     LESI/Dr. Yves Dill  . TONSILLECTOMY     adenoids, and uvula removed for sleep apnes  . TUBAL LIGATION      There were no vitals filed for this visit.   Subjective Assessment - 05/27/20 0758    Subjective Working on her exercises some at home. Has tried using the noodle in the car but it was not very comfortable.    Currently in Pain? No/denies    Pain Score 0-No pain                             [facility] Adult PT Treatment/Exercise - 05/27/20 0001      Shoulder Exercises: Standing   Other Standing Exercises axial extension 10 sec x 5; scap squeeze 10 sec x 5; L's x 10; W's x 10 with noodle     Other Standing Exercises counter plank 30 sec x 1; pelvic floor 10 sec hold x 10 LE IR       Shoulder Exercises: Stretch   Other Shoulder Stretches pec stretch in doorway 30 sec x 2 reps each position       Moist Heat Therapy   Number Minutes Moist Heat 10 Minutes    Moist Heat Location Shoulder;Cervical      Electrical Stimulation   Electrical  Stimulation Location bilat upper trap; mid thoracic     Electrical Stimulation Action TENS    Electrical Stimulation Parameters to tolerance    Electrical Stimulation Goals Pain;Tone      Manual Therapy   Manual therapy comments skilled palpation to assess tissue response to DN and manual work     Joint Mobilization upper thoracic and lower cervical PA mobs     Soft tissue mobilization deep tissue work through the upper trap; cervical and thoracic paraspinals; scaleni bilat     Myofascial Release thoracic paraspinals             Trigger Point Dry Needling - 05/27/20 0001    Consent Given? Yes    Education Handout Provided Yes    Muscles Treated Head and Neck Upper trapezius;Suboccipitals;Scalenes;Cervical multifidi    Upper Trapezius Response Palpable increased muscle length    Suboccipitals Response Palpable increased muscle length    Scalenes Response Palpable increased muscle length    Cervical multifidi Response Palpable increased muscle length  Thoracic multifidi response Palpable increased muscle length                PT Education - 05/27/20 0818    Education Details HEP DN    Person(s) Educated Patient    Methods Explanation;Demonstration;Tactile cues;Verbal cues;Handout    Comprehension Verbalized understanding;Returned demonstration;Verbal cues required;Tactile cues required               PT Long Term Goals - 05/25/20 0953      PT LONG TERM GOAL #1   Title Improve posture and alignment with patient to demonstrate more upright posture with posterior shoulder girdle engaged    Time 6    Period Weeks    Status New    Target Date 07/06/20      PT LONG TERM GOAL #2   Title Decrease frequency, intensity and duration of pain by 75-100% allowing patient to return to normal functional and recreational activities    Time 6    Period Weeks    Status New    Target Date 07/06/20      PT LONG TERM GOAL #3   Title Increase cervical ROM to WFL's and pain free  in all planes    Time 6    Period Weeks    Status New    Target Date 07/06/20      PT LONG TERM GOAL #4   Title Independent in HEP    Time 6    Period Weeks    Status New    Target Date 07/06/20      PT LONG TERM GOAL #5   Title Improve FOTO to </= 37% limitation    Time 6    Period Weeks    Status New    Target Date 07/06/20                 Plan - 05/27/20 0806    Clinical Impression Statement Patient has worked on exercises at home. Reviewed and corrected exercises. Trial of DN and manual work through the cervical spine.    Rehab Potential Good    PT Frequency 2x / week    PT Duration 6 weeks    PT Treatment/Interventions Patient/family education;ADLs/Self Care Home Management;Cryotherapy;Electrical Stimulation;Iontophoresis 4mg /ml Dexamethasone;Moist Heat;Ultrasound;Therapeutic activities;Therapeutic exercise;Neuromuscular re-education;Manual techniques;Dry needling;Taping;Other (comment)    PT Next Visit Plan review HEP; progress with postural correction(prolonged snow angel/neural mobilization/nerve gliding supine); thoracic mobs; assess response to manual work and DN; modalities as indicated    PT Home Exercise Plan [email or web address]    Consulted and Agree with Plan of Care Patient           Patient will benefit from skilled therapeutic intervention in order to improve the following deficits and impairments:     Visit Diagnosis: Cervicalgia  Radiculopathy, cervical region  Other symptoms and signs involving the musculoskeletal system  Abnormal posture     Problem List Patient Active Problem List   Diagnosis Date Noted  . DDD (degenerative disc disease), cervical 05/20/2020  . Lumbar spinal stenosis 01/15/2019  . Hypothyroid 10/29/2015  . Breast mass, right 09/29/2015  . Macular degeneration, bilateral 07/28/2015  . Osteopenia 07/28/2015  . Hearing loss 04/02/2013  . Hyponatremia 07/19/2011  . FATIGUE 06/25/2010  . IMPAIRED FASTING GLUCOSE 09/01/2008   . Hyperlipidemia 09/19/2006  . HYPERTENSION, BENIGN SYSTEMIC 09/19/2006  . Symptomatic menopausal or female climacteric states 09/19/2006  . ATOPIC DERMATITIS 09/19/2006  . INSOMNIA NOS 09/19/2006    Fronnie Urton Nilda Simmer PT, MPH  05/27/2020, 8:44  AM  [facility] [address] [place], [place], [address] Phone: [phone]   Fax:  [phone]  Name: Rebecca Austin MRN: [id number] Date of Birth: 1951/05/30

## 2020-05-28 ENCOUNTER — Ambulatory Visit (INDEPENDENT_AMBULATORY_CARE_PROVIDER_SITE_OTHER): Payer: Medicare Other | Admitting: Family Medicine

## 2020-05-28 ENCOUNTER — Encounter: Payer: Self-pay | Admitting: Family Medicine

## 2020-05-28 VITALS — BP 139/62 | HR 59 | Ht 61.0 in | Wt 144.0 lb

## 2020-05-28 DIAGNOSIS — E039 Hypothyroidism, unspecified: Secondary | ICD-10-CM | POA: Diagnosis not present

## 2020-05-28 DIAGNOSIS — M67432 Ganglion, left wrist: Secondary | ICD-10-CM

## 2020-05-28 DIAGNOSIS — I10 Essential (primary) hypertension: Secondary | ICD-10-CM | POA: Diagnosis not present

## 2020-05-28 DIAGNOSIS — R1013 Epigastric pain: Secondary | ICD-10-CM

## 2020-05-28 DIAGNOSIS — L600 Ingrowing nail: Secondary | ICD-10-CM | POA: Diagnosis not present

## 2020-05-28 DIAGNOSIS — B079 Viral wart, unspecified: Secondary | ICD-10-CM

## 2020-05-28 DIAGNOSIS — K769 Liver disease, unspecified: Secondary | ICD-10-CM

## 2020-05-28 MED ORDER — ONDANSETRON 4 MG PO TBDP: 4.0000 mg | ORAL_TABLET | Freq: Three times a day (TID) | ORAL | 0 refills | Status: AC | PRN

## 2020-05-28 NOTE — Assessment & Plan Note (Signed)
Well controlled. Continue current regimen. Follow up in  6 mo  

## 2020-05-28 NOTE — Progress Notes (Signed)
Established Patient Office Visit  Subjective:  Patient ID: Rebecca Austin, female    DOB: 08/04/1951  Age: 69 y.o. MRN: [id number]  CC:  Chief Complaint  Patient presents with  . Hypertension    HPI Rebecca Austin presents for   Hypertension- Pt denies chest pain, SOB, dizziness, or heart palpitations.  Taking meds as directed w/o problems.  Denies medication side effects.    Ganglion cyst on left wrist is better.  As it actually has gotten smaller  Ingrown toenail on right foot - she says it is stil tender.  She does feel like it is healing well she keeps it covered during the day because she works out in the garden but she leaves it open to the air at night.  She says that the wart on her finger did blister and then peel she still has a little bit of rough skin there.  Liver lesions-she does have her MRI scheduled for Monday.   Past Medical History:  Diagnosis Date  . DDD (degenerative disc disease), lumbar   . Depression   . IFG (impaired fasting glucose)   . Obesity   . Plantar fasciitis    See's Dr. Ihor Gully  . Thyroid disease   . Tobacco abuse     Past Surgical History:  Procedure Laterality Date  . CESAREAN SECTION    . fluroscopy     LESI/Dr. Yves Dill  . TONSILLECTOMY     adenoids, and uvula removed for sleep apnes  . TUBAL LIGATION      Family History  Problem Relation Age of Onset  . Osteoporosis Sister   . Hypertension Father   . Depression Father   . Breast cancer Mother 5       postmenopausal breast CA  . Diabetes Maternal Grandfather     Social History   Socioeconomic History  . Marital status: Married    Spouse name: Not on file  . Number of children: 1  . Years of education: Not on file  . Highest education level: Not on file  Occupational History  . Occupation: REtired Air cabin crew   Tobacco Use  . Smoking status: Former Smoker    Types: Cigarettes    Quit date: 12/12/2009    Years since quitting: 10.4  . Smokeless  tobacco: Never Used  Substance and Sexual Activity  . Alcohol use: Yes    Alcohol/week: 1.0 standard drink    Types: 1 Glasses of wine per week  . Drug use: No  . Sexual activity: Not Currently    Partners: Male    Birth control/protection: Post-menopausal    Comment: 2nd marriage,  85 yr old son in prison in [place] and gets out 07, walks daily.  Other Topics Concern  . Not on file  Social History Narrative   Working out regulary 3 times per week.    Social Determinants of Health   Financial Resource Strain:   . Difficulty of Paying Living Expenses:   Food Insecurity:   . Worried About Programme researcher, broadcasting/film/video in the Last Year:   . Barista in the Last Year:   Transportation Needs:   . Freight forwarder (Medical):   Marland Kitchen Lack of Transportation (Non-Medical):   Physical Activity:   . Days of Exercise per Week:   . Minutes of Exercise per Session:   Stress:   . Feeling of Stress :   Social Connections:   . Frequency of Communication with Friends and Family:   .  Frequency of Social Gatherings with Friends and Family:   . Attends Religious Services:   . Active Member of Clubs or Organizations:   . Attends Archivist Meetings:   Marland Kitchen Marital Status:   Intimate Partner Violence:   . Fear of Current or Ex-Partner:   . Emotionally Abused:   Marland Kitchen Physically Abused:   . Sexually Abused:     Outpatient Medications Prior to Visit  Medication Sig Dispense Refill  . amLODipine (NORVASC) 10 MG tablet Take 1 tablet (10 mg total) by mouth daily. 90 tablet 1  . Calcium Citrate-Vitamin D (CALCIUM CITRATE + D) 250-200 MG-UNIT TABS Take 2 tablets by mouth 2 (two) times daily.    . carisoprodol (SOMA) 350 MG tablet Take 1 tablet (350 mg total) by mouth daily as needed for muscle spasms. 90 tablet 0  . estradiol (ESTRACE) 1 MG tablet TAKE 1 TABLET TWICE A DAY 180 tablet 0  . gabapentin (NEURONTIN) 600 MG tablet Take 2 tablets (1,200 mg total) by mouth 3 (three) times daily. 540  tablet 3  . levothyroxine (SYNTHROID) 50 MCG tablet TAKE 1 TABLET DAILY 90 tablet 1  . losartan (COZAAR) 100 MG tablet TAKE 1 TABLET DAILY 90 tablet 1  . medroxyPROGESTERone (PROVERA) 2.5 MG tablet TAKE 1 TABLET BY MOUTH  DAILY 90 tablet 2  . Multiple Vitamins-Minerals (PRESERVISION AREDS PO) Take by mouth.    . naproxen (NAPROSYN) 500 MG tablet TAKE 1 TABLET BY MOUTH 2 TIMES DAILY WITH A MEAL. 180 tablet 1  . omeprazole (PRILOSEC) 40 MG capsule TAKE 1 CAPSULE BY MOUTH EVERY DAY 90 capsule 1   No facility-administered medications prior to visit.    Allergies  Allergen Reactions  . Codeine Rash  . Hydrochlorothiazide Other (See Comments)    Hyponatremia   . Penicillins Rash    ROS Review of Systems    Objective:    Physical Exam Vitals reviewed.  Constitutional:      Appearance: She is well-developed.  HENT:     Head: Normocephalic and atraumatic.  Eyes:     Conjunctiva/sclera: Conjunctivae normal.  Cardiovascular:     Rate and Rhythm: Normal rate.  Pulmonary:     Effort: Pulmonary effort is normal.  Skin:    General: Skin is dry.     Coloration: Skin is not pale.     Comments: Skin on second toe on right foot is healing well there is just some dry scabbing skin.  Where the wart has peeled off her finger there is a little bit of thickened skin but it does look like it is healing well.  And the ganglion cyst is smaller  Neurological:     Mental Status: She is alert and oriented to person, place, and time.  Psychiatric:        Behavior: Behavior normal.     BP 139/62   Pulse (!) 59   Ht 5\' 1"  (1.549 m)   Wt 144 lb (65.3 kg)   SpO2 100%   BMI 27.21 kg/m  Wt Readings from Last 3 Encounters:  05/28/20 144 lb (65.3 kg)  05/18/20 144 lb (65.3 kg)  11/28/19 148 lb (67.1 kg)     There are no preventive care reminders to display for this patient.  There are no preventive care reminders to display for this patient.  Lab Results  Component Value Date   TSH 2.41  11/28/2019   Lab Results  Component Value Date   WBC 5.7 07/14/2011   HGB  13.0 07/14/2011   HCT 40.1 07/14/2011   MCV 87.4 07/14/2011   PLT 244 07/14/2011   Lab Results  Component Value Date   NA 143 11/28/2019   K 3.9 11/28/2019   CO2 23 11/28/2019   GLUCOSE 75 11/28/2019   BUN 14 11/28/2019   CREATININE 0.88 11/28/2019   BILITOT 0.6 04/24/2019   ALKPHOS 60 07/21/2016   AST 16 04/24/2019   ALT 9 04/24/2019   PROT 6.3 04/24/2019   ALBUMIN 4.1 07/21/2016   CALCIUM 8.8 11/28/2019   Lab Results  Component Value Date   CHOL 262 (H) 04/24/2019   Lab Results  Component Value Date   HDL 58 04/24/2019   Lab Results  Component Value Date   LDLCALC 177 (H) 04/24/2019   Lab Results  Component Value Date   TRIG 135 04/24/2019   Lab Results  Component Value Date   CHOLHDL 4.5 04/24/2019   Lab Results  Component Value Date   HGBA1C 5.2 11/28/2019      Assessment & Plan:   Problem List Items Addressed This Visit      Cardiovascular and Mediastinum   HYPERTENSION, BENIGN SYSTEMIC - Primary    Well controlled. Continue current regimen. Follow up in  6 mo      Relevant Orders   COMPLETE METABOLIC PANEL WITH GFR   Lipid panel   CBC     Endocrine   Hypothyroid   Relevant Orders   TSH    Other Visit Diagnoses    Ingrown nail of second toe of right foot       Ganglion cyst of wrist, left       Viral warts, unspecified type       Liver lesion       Epigastric pain         Ingrown nail-patient tolerated removal well.  Skin is healing well no sign of active infection.  Ganglion cyst-seems much smaller.  Even though we were not able to aspirate any of the internal substance we did inject the Kenalog and that seems to be helping.  Her wart on her right hand seems to be resolving as well.  Liver lesion-MRI scheduled for Monday.  Gastric pain-still having some nausea and epigastric pain.  Still think this could be coming from her gallbladder but since we  discovered these liver lesions on evaluate that first but will likely end up still referring her to GI for further evaluation.  No sign of active cholecystitis.    No orders of the defined types were placed in this encounter.   Follow-up: Return if symptoms worsen or fail to improve.    Nani Gasser, MD

## 2020-06-01 ENCOUNTER — Ambulatory Visit (INDEPENDENT_AMBULATORY_CARE_PROVIDER_SITE_OTHER): Payer: Medicare Other

## 2020-06-01 ENCOUNTER — Other Ambulatory Visit: Payer: Self-pay

## 2020-06-01 ENCOUNTER — Encounter: Payer: Medicare Other | Admitting: Rehabilitative and Restorative Service Providers"

## 2020-06-01 DIAGNOSIS — K769 Liver disease, unspecified: Secondary | ICD-10-CM | POA: Diagnosis not present

## 2020-06-01 DIAGNOSIS — R1011 Right upper quadrant pain: Secondary | ICD-10-CM | POA: Diagnosis not present

## 2020-06-01 MED ORDER — GADOBUTROL 1 MMOL/ML IV SOLN
6.5000 mL | Freq: Once | INTRAVENOUS | Status: AC | PRN
  Administered 2020-06-01: 7.5 mL via INTRAVENOUS

## 2020-06-03 ENCOUNTER — Encounter: Payer: Self-pay | Admitting: Family Medicine

## 2020-06-03 ENCOUNTER — Other Ambulatory Visit: Payer: Self-pay | Admitting: Family Medicine

## 2020-06-03 DIAGNOSIS — R1011 Right upper quadrant pain: Secondary | ICD-10-CM

## 2020-06-03 DIAGNOSIS — K8012 Calculus of gallbladder with acute and chronic cholecystitis without obstruction: Secondary | ICD-10-CM

## 2020-06-03 NOTE — Telephone Encounter (Signed)
Cindy: Referral placed for general surgery.  Please cancel referral for gastroenterology.

## 2020-06-04 ENCOUNTER — Other Ambulatory Visit: Payer: Self-pay

## 2020-06-04 ENCOUNTER — Encounter: Payer: Self-pay | Admitting: Rehabilitative and Restorative Service Providers"

## 2020-06-04 ENCOUNTER — Ambulatory Visit (INDEPENDENT_AMBULATORY_CARE_PROVIDER_SITE_OTHER): Payer: Medicare Other | Admitting: Rehabilitative and Restorative Service Providers"

## 2020-06-04 ENCOUNTER — Telehealth: Payer: Self-pay

## 2020-06-04 DIAGNOSIS — M542 Cervicalgia: Secondary | ICD-10-CM | POA: Diagnosis present

## 2020-06-04 DIAGNOSIS — M5412 Radiculopathy, cervical region: Secondary | ICD-10-CM

## 2020-06-04 DIAGNOSIS — R29898 Other symptoms and signs involving the musculoskeletal system: Secondary | ICD-10-CM

## 2020-06-04 DIAGNOSIS — R293 Abnormal posture: Secondary | ICD-10-CM | POA: Diagnosis not present

## 2020-06-04 NOTE — Therapy (Addendum)
[place], [place], [address] Phone: [phone]   Fax:  [phone]  Physical Therapy Treatment  Patient Details  Name: Rebecca Austin MRN: [id number] Date of Birth: [birth year] Referring Provider (PT): Dr Dianah Field    Encounter Date: 06/04/2020   PT End of Session - 06/04/20 0856    Visit Number 3    Number of Visits 12    Date for PT Re-Evaluation 07/06/20    PT Start Time 2458   pt late for appt   PT Stop Time 0943    PT Time Calculation (min) 48 min    Activity Tolerance Patient tolerated treatment well           Past Medical History:  Diagnosis Date  . DDD (degenerative disc disease), lumbar   . Depression   . IFG (impaired fasting glucose)   . Obesity   . Plantar fasciitis    See's Dr. Jetta Lout  . Thyroid disease   . Tobacco abuse     Past Surgical History:  Procedure Laterality Date  . CESAREAN SECTION    . fluroscopy     LESI/Dr. Sharlet Salina  . TONSILLECTOMY     adenoids, and uvula removed for sleep apnes  . TUBAL LIGATION      There were no vitals filed for this visit.   Subjective Assessment - 06/04/20 0857    Subjective Patient reports that the DN seems to help. Tightness seems to be neck to mid and low back. Will be scheduling a gall bladder surgery,    Currently in Pain? No/denies                             [facility] Adult PT Treatment/Exercise - 06/04/20 0001      Knee/Hip Exercises: Supine   Bridges Strengthening;Both;10 reps   VC to tighten core    Other Supine Knee/Hip Exercises 4 part core 10 sec x 10     Other Supine Knee/Hip Exercises clam alternating LE blue TB x 15 each LE VC for core engaged       Shoulder Exercises: Prone   Other Prone Exercises scap squeeze arms at side w/ axial extension 5 sec hold x 10       Shoulder Exercises: Standing   Other Standing Exercises axial extension 10 sec x 5; scap squeeze 10 sec x 5; L's x 10;  W's x 10 with noodle     Other Standing Exercises counter plank 30 sec x 1; pelvic floor 10 sec hold x 10 LE IR       Shoulder Exercises: Stretch   Other Shoulder Stretches pec stretch in doorway 30 sec x 2 reps each position       Moist Heat Therapy   Number Minutes Moist Heat 10 Minutes    Moist Heat Location Shoulder;Cervical;Lumbar Spine      Manual Therapy   Manual therapy comments skilled palpation to assess tissue response to DN and manual work     Soft tissue mobilization deep tissue work through Rt > Lt lumbar and SI area into bilat posterior hips - piriformis/abductors             Trigger Point Dry Needling - 06/04/20 0001    Consent Given? Yes    Education Handout Provided Previously provided    Other Dry Needling bilat     Gluteus Maximus Response Palpable increased muscle length    Piriformis  Response Palpable increased muscle length    Lumbar multifidi Response Palpable increased muscle length    Quadratus Lumborum Response Palpable increased muscle length                PT Education - 06/04/20 0924    Education Details HEP    Person(s) Educated Patient    Methods Explanation;Demonstration;Tactile cues;Verbal cues;Handout    Comprehension Verbalized understanding;Returned demonstration;Verbal cues required;Tactile cues required               PT Long Term Goals - 05/25/20 0953      PT LONG TERM GOAL #1   Title Improve posture and alignment with patient to demonstrate more upright posture with posterior shoulder girdle engaged    Time 6    Period Weeks    Status New    Target Date 07/06/20      PT LONG TERM GOAL #2   Title Decrease frequency, intensity and duration of pain by 75-100% allowing patient to return to normal functional and recreational activities    Time 6    Period Weeks    Status New    Target Date 07/06/20      PT LONG TERM GOAL #3   Title Increase cervical ROM to WFL's and pain free in all planes    Time 6    Period Weeks     Status New    Target Date 07/06/20      PT LONG TERM GOAL #4   Title Independent in HEP    Time 6    Period Weeks    Status New    Target Date 07/06/20      PT LONG TERM GOAL #5   Title Improve FOTO to </= 37% limitation    Time 6    Period Weeks    Status New    Target Date 07/06/20                 Plan - 06/04/20 0916    Clinical Impression Statement Improving cervical symptoms with decreased pain. Report of pain in the LB/SI area today. Good response to DN and manual work. Added upper core and lower core stabilization exercises with some difficulty with 4 part core.    Rehab Potential Good    PT Frequency 2x / week    PT Treatment/Interventions Patient/family education;ADLs/Self Care Home Management;Cryotherapy;Electrical Stimulation;Iontophoresis 50m/ml Dexamethasone;Moist Heat;Ultrasound;Therapeutic activities;Therapeutic exercise;Neuromuscular re-education;Manual techniques;Dry needling;Taping;Other (comment)    PT Next Visit Plan review HEP; progress with postural correction(prolonged snow angel/neural mobilization/nerve gliding supine); thoracic mobs; assess response to manual work and DN; modalities as indicated    PT Home Exercise Plan [email or web address]    Consulted and Agree with Plan of Care Patient           Patient will benefit from skilled therapeutic intervention in order to improve the following deficits and impairments:     Visit Diagnosis: Cervicalgia  Radiculopathy, cervical region  Other symptoms and signs involving the musculoskeletal system  Abnormal posture     Problem List Patient Active Problem List   Diagnosis Date Noted  . DDD (degenerative disc disease), cervical 05/20/2020  . Lumbar spinal stenosis 01/15/2019  . Hypothyroid 10/29/2015  . Breast mass, right 09/29/2015  . Macular degeneration, bilateral 07/28/2015  . Osteopenia 07/28/2015  . Hearing loss 04/02/2013  . Hyponatremia 07/19/2011  . FATIGUE 06/25/2010  . IMPAIRED  FASTING GLUCOSE 09/01/2008  . Hyperlipidemia 09/19/2006  . HYPERTENSION, BENIGN SYSTEMIC 09/19/2006  . Symptomatic menopausal or  female climacteric states 09/19/2006  . ATOPIC DERMATITIS 09/19/2006  . INSOMNIA NOS 09/19/2006    Treina Arscott Nilda Simmer PT, MPH  06/04/2020, 9:32 AM  [facility] [place], [place], [address] Phone: [phone]   Fax:  [phone]  Name: Rebecca Austin MRN: [id number] Date of Birth: 06/19/1951  PHYSICAL THERAPY DISCHARGE SUMMARY  Visits from Start of Care: 3  Current functional level related to goals / functional outcomes: See progress note for discharge status    Remaining deficits: Unknown    Education / Equipment: HEP  Plan: Patient agrees to discharge.  Patient goals were met. Patient is being discharged due to meeting the stated rehab goals.  ?????    Yarissa Reining P. Helene Kelp PT, MPH 06/25/20 9:41 AM

## 2020-06-04 NOTE — Patient Instructions (Signed)
Access Code: [email or web address]: [email or web address]: 06/24/2021Prepared by: Yasheka Fossett HoltExercises  Seated Cervical Retraction - 2 x daily - 7 x weekly - 1 sets - 5-10 reps - 5-10 sec hold  Seated Scapular Retraction - 2 x daily - 7 x weekly - 1 sets - 10 reps - 10 sec hold  Shoulder External Rotation and Scapular Retraction - 2 x daily - 7 x weekly - 1 sets - 10 reps - 1-2 sec hold  Shoulder External Rotation in 45 Degrees Abduction - 2 x daily - 7 x weekly - 1 sets - 10 reps - 1-2 sec hold  Doorway Pec Stretch at 60 Degrees Abduction - 3 x daily - 7 x weekly - 3 reps - 1 sets  Doorway Pec Stretch at 90 Degrees Abduction - 3 x daily - 7 x weekly - 3 reps - 1 sets - 30 seconds hold  Doorway Pec Stretch at 120 Degrees Abduction - 3 x daily - 7 x weekly - 3 reps - 1 sets - 30 second hold hold  Plank on Counter - 2 x daily - 7 x weekly - 1 sets - 3 reps - 30-60 sec hold  Prone Cervical Retraction - 2 x daily - 7 x weekly - 1 sets - 5-10 reps - 5-10 sec hold  Supine Transversus Abdominis Bracing with Pelvic Floor Contraction - 2 x daily - 7 x weekly - 1 sets - 10 reps - 10sec hold  Supine Bridge - 2 x daily - 7 x weekly - 1 sets - 10 reps - 5 sec hold  Hooklying Isometric Clamshell - 2 x daily - 7 x weekly - 1 sets - 10 reps - 3 sec hold

## 2020-06-04 NOTE — Telephone Encounter (Signed)
I cancelled referral for GI - CF

## 2020-06-04 NOTE — Telephone Encounter (Signed)
Patient called wanting to make sure referral was put in for gallbladder removal. Confirmation has been confirmed with patient- patient voiced her understanding.

## 2020-06-05 ENCOUNTER — Ambulatory Visit: Payer: Medicare Other | Admitting: Gastroenterology

## 2020-06-05 NOTE — Telephone Encounter (Signed)
Rebecca Austin, can you can cancel the GI referral.  Patient changed her mind and would like to see GI first.  If we have to start all over and put a new order and then just let Tonya know.

## 2020-06-08 ENCOUNTER — Encounter: Payer: Medicare Other | Admitting: Rehabilitative and Restorative Service Providers"

## 2020-06-08 NOTE — Telephone Encounter (Signed)
Gave to [facility] she is doing referrals today - CF

## 2020-06-11 ENCOUNTER — Encounter: Payer: Medicare Other | Admitting: Rehabilitative and Restorative Service Providers"

## 2020-07-01 ENCOUNTER — Ambulatory Visit (INDEPENDENT_AMBULATORY_CARE_PROVIDER_SITE_OTHER): Payer: Medicare Other | Admitting: Sports Medicine

## 2020-07-01 ENCOUNTER — Encounter: Payer: Self-pay | Admitting: Sports Medicine

## 2020-07-01 DIAGNOSIS — M5030 Other cervical disc degeneration, unspecified cervical region: Secondary | ICD-10-CM

## 2020-07-01 NOTE — Assessment & Plan Note (Signed)
Has done extremely well with conservative treatment for her neck pain, pain-free now, we will continue home rehab exercises, occasional meloxicam, return as needed, there is a low risk of morbidity with continued conservative care.

## 2020-07-01 NOTE — Progress Notes (Signed)
    Procedures performed today:    None.  Independent interpretation of notes and tests performed by another provider:   None.  Brief History, Exam, Impression, and Recommendations:    DDD (degenerative disc disease), cervical Has done extremely well with conservative treatment for her neck pain, pain-free now, we will continue home rehab exercises, occasional meloxicam, return as needed, there is a low risk of morbidity with continued conservative care.    ___________________________________________ [name]. Benjamin Stain, M.D., ABFM., CAQSM. Primary Care and Sports Medicine [facility]  Adjunct Instructor of Family Medicine  [employer]

## 2020-08-10 ENCOUNTER — Other Ambulatory Visit: Payer: Self-pay | Admitting: Family Medicine

## 2020-08-17 ENCOUNTER — Other Ambulatory Visit: Payer: Self-pay | Admitting: Family Medicine

## 2020-09-13 ENCOUNTER — Other Ambulatory Visit: Payer: Self-pay | Admitting: Sports Medicine

## 2020-09-13 DIAGNOSIS — M48061 Spinal stenosis, lumbar region without neurogenic claudication: Secondary | ICD-10-CM

## 2020-09-15 DIAGNOSIS — Z23 Encounter for immunization: Secondary | ICD-10-CM | POA: Diagnosis not present

## 2020-09-22 ENCOUNTER — Encounter: Payer: Self-pay | Admitting: Family Medicine

## 2020-09-22 ENCOUNTER — Other Ambulatory Visit: Payer: Self-pay

## 2020-09-22 ENCOUNTER — Ambulatory Visit (INDEPENDENT_AMBULATORY_CARE_PROVIDER_SITE_OTHER): Payer: Medicare Other | Admitting: Family Medicine

## 2020-09-22 VITALS — BP 149/74 | HR 65 | Temp 98.4°F | Wt 144.0 lb

## 2020-09-22 DIAGNOSIS — R109 Unspecified abdominal pain: Secondary | ICD-10-CM

## 2020-09-22 DIAGNOSIS — G6289 Other specified polyneuropathies: Secondary | ICD-10-CM | POA: Diagnosis not present

## 2020-09-22 DIAGNOSIS — M48061 Spinal stenosis, lumbar region without neurogenic claudication: Secondary | ICD-10-CM

## 2020-09-22 DIAGNOSIS — H6120 Impacted cerumen, unspecified ear: Secondary | ICD-10-CM | POA: Insufficient documentation

## 2020-09-22 DIAGNOSIS — H6122 Impacted cerumen, left ear: Secondary | ICD-10-CM

## 2020-09-22 DIAGNOSIS — G629 Polyneuropathy, unspecified: Secondary | ICD-10-CM | POA: Insufficient documentation

## 2020-09-22 LAB — POCT URINALYSIS DIP (CLINITEK)
Bilirubin, UA: NEGATIVE
Blood, UA: NEGATIVE
Glucose, UA: NEGATIVE mg/dL
Ketones, POC UA: NEGATIVE mg/dL
Leukocytes, UA: NEGATIVE
Nitrite, UA: NEGATIVE
POC PROTEIN,UA: NEGATIVE
Spec Grav, UA: 1.01 (ref 1.010–1.025)
Urobilinogen, UA: 0.2 E.U./dL
pH, UA: 8 (ref 5.0–8.0)

## 2020-09-22 MED ORDER — GABAPENTIN 600 MG PO TABS: ORAL_TABLET | ORAL | 3 refills | Status: AC

## 2020-09-22 NOTE — Assessment & Plan Note (Signed)
UA normal.  Low suspicion for stone.  Possibly related to intermittent spasm.  She will discuss OAB symptoms with Dr. Linford Arnold at f/u visit.

## 2020-09-22 NOTE — Progress Notes (Signed)
Rebecca Austin - 69 y.o. female MRN [id number]  Date of birth: [birth year]  Subjective Chief Complaint  Patient presents with  . ear clogged  . Flank Pain  . Pruritis    HPI Rebecca Austin is a 69 y.o. female here today with complaint of L ear feeling clogged, R flan pain and itching and tingling sensation in extremities.   -Reports muffled sound and feeling that L ear is clogged.  Has tried OTC kit to flush out ear without improvement.  Denies pain, drainage or bleeding.   -Flank pain started this morning.  Comes and goes.  Describes as sharp and only last a few seconds.  She denies dysuria.  She has had some urinary frequency but thinks she may have OAB as she feels like she has to urinate all the time.    -Recurrent itching/tingling sensation in legs as well as fingertips and toes.  She does have neuropathy.  She denies rash associated with this.    ROS:  A comprehensive ROS was completed and negative except as noted per HPI  Allergies  Allergen Reactions  . Codeine Rash  . Hydrochlorothiazide Other (See Comments)    Hyponatremia   . Penicillins Rash    Past Medical History:  Diagnosis Date  . DDD (degenerative disc disease), lumbar   . Depression   . IFG (impaired fasting glucose)   . Obesity   . Plantar fasciitis    See's Dr. Jetta Lout  . Thyroid disease   . Tobacco abuse     Past Surgical History:  Procedure Laterality Date  . CESAREAN SECTION    . fluroscopy     LESI/Dr. Sharlet Salina  . TONSILLECTOMY     adenoids, and uvula removed for sleep apnes  . TUBAL LIGATION      Social History   Socioeconomic History  . Marital status: Married    Spouse name: Not on file  . Number of children: 1  . Years of education: Not on file  . Highest education level: Not on file  Occupational History  . Occupation: REtired Scientist, forensic   Tobacco Use  . Smoking status: Former Smoker    Types: Cigarettes    Quit date: 12/12/2009    Years since quitting: 10.7  .  Smokeless tobacco: Never Used  Substance and Sexual Activity  . Alcohol use: Yes    Alcohol/week: 1.0 standard drink    Types: 1 Glasses of wine per week  . Drug use: No  . Sexual activity: Not Currently    Partners: Male    Birth control/protection: Post-menopausal    Comment: 2nd marriage,  12 yr old son in prison in [place] and gets out 07, walks daily.  Other Topics Concern  . Not on file  Social History Narrative   Working out regulary 3 times per week.    Social Determinants of Health   Financial Resource Strain:   . Difficulty of Paying Living Expenses: Not on file  Food Insecurity:   . Worried About Charity fundraiser in the Last Year: Not on file  . Ran Out of Food in the Last Year: Not on file  Transportation Needs:   . Lack of Transportation (Medical): Not on file  . Lack of Transportation (Non-Medical): Not on file  Physical Activity:   . Days of Exercise per Week: Not on file  . Minutes of Exercise per Session: Not on file  Stress:   . Feeling of Stress : Not on file  Social Connections:   . Frequency of Communication with Friends and Family: Not on file  . Frequency of Social Gatherings with Friends and Family: Not on file  . Attends Religious Services: Not on file  . Active Member of Clubs or Organizations: Not on file  . Attends Archivist Meetings: Not on file  . Marital Status: Not on file    Family History  Problem Relation Age of Onset  . Osteoporosis Sister   . Hypertension Father   . Depression Father   . Breast cancer Mother 73       postmenopausal breast CA  . Diabetes Maternal Grandfather     Health Maintenance  Topic Date Due  . INFLUENZA VACCINE  07/12/2020  . COLONOSCOPY  12/12/2020  . TETANUS/TDAP  09/19/2021  . DEXA SCAN  Completed  . COVID-19 Vaccine  Completed  . Hepatitis C Screening  Completed  . PNA vac Low Risk Adult  Completed  . MAMMOGRAM  Discontinued      ----------------------------------------------------------------------------------------------------------------------------------------------------------------------------------------------------------------- Physical Exam BP (!) 149/74 (BP Location: Left Arm, Patient Position: Sitting, Cuff Size: Normal)   Pulse 65   Temp 98.4 F (36.9 C)   Wt 144 lb (65.3 kg)   SpO2 100%   BMI 27.21 kg/m   Physical Exam Constitutional:      Appearance: Normal appearance.  HENT:     Head: Normocephalic and atraumatic.     Right Ear: Tympanic membrane and ear canal normal.     Ears:     Comments: Cerumen impaction of the L ear.  Lavage completed with clearance of canal. Re-examination shows normal canal and TM Eyes:     General: No scleral icterus. Cardiovascular:     Rate and Rhythm: Normal rate and regular rhythm.  Pulmonary:     Effort: Pulmonary effort is normal.     Breath sounds: Normal breath sounds.  Abdominal:     General: There is no distension.     Tenderness: There is no abdominal tenderness. There is no right CVA tenderness or left CVA tenderness.  Skin:    General: Skin is warm and dry.  Neurological:     General: No focal deficit present.     Mental Status: She is alert.  Psychiatric:        Mood and Affect: Mood normal.        Behavior: Behavior normal.     ------------------------------------------------------------------------------------------------------------------------------------------------------------------------------------------------------------------- Assessment and Plan  Flank pain UA normal.  Low suspicion for stone.  Possibly related to intermittent spasm.  She will discuss OAB symptoms with Dr. Madilyn Fireman at f/u visit.   Peripheral neuropathy I think the sensation of itching and tingling is related to neuropathy.   She will continue on gabapentin for this.   Cerumen impaction L ear lavage completed today.  F/u as needed for this.      No orders of the defined types were placed in this encounter.  Orders Placed This Encounter  Procedures  . POCT URINALYSIS DIP (CLINITEK)     No follow-ups on file.    This visit occurred during the SARS-CoV-2 public health emergency.  Safety protocols were in place, including screening questions prior to the visit, additional usage of staff PPE, and extensive cleaning of exam room while observing appropriate contact time as indicated for disinfecting solutions.

## 2020-09-22 NOTE — Patient Instructions (Signed)
Earwax Buildup, Adult The ears produce a substance called earwax that helps keep bacteria out of the ear and protects the skin in the ear canal. Occasionally, earwax can build up in the ear and cause discomfort or hearing loss. What increases the risk? This condition is more likely to develop in people who:  Are female.  Are elderly.  Naturally produce more earwax.  Clean their ears often with cotton swabs.  Use earplugs often.  Use in-ear headphones often.  Wear hearing aids.  Have narrow ear canals.  Have earwax that is overly thick or sticky.  Have eczema.  Are dehydrated.  Have excess hair in the ear canal. What are the signs or symptoms? Symptoms of this condition include:  Reduced or muffled hearing.  A feeling of fullness in the ear or feeling that the ear is plugged.  Fluid coming from the ear.  Ear pain.  Ear itch.  Ringing in the ear.  Coughing.  An obvious piece of earwax that can be seen inside the ear canal. How is this diagnosed? This condition may be diagnosed based on:  Your symptoms.  Your medical history.  An ear exam. During the exam, your health care provider will look into your ear with an instrument called an otoscope. You may have tests, including a hearing test. How is this treated? This condition may be treated by:  Using ear drops to soften the earwax.  Having the earwax removed by a health care provider. The health care provider may: ? Flush the ear with water. ? Use an instrument that has a loop on the end (curette). ? Use a suction device.  Surgery to remove the wax buildup. This may be done in severe cases. Follow these instructions at home:   Take over-the-counter and prescription medicines only as told by your health care provider.  Do not put any objects, including cotton swabs, into your ear. You can clean the opening of your ear canal with a washcloth or facial tissue.  Follow instructions from your health care  provider about cleaning your ears. Do not over-clean your ears.  Drink enough fluid to keep your urine clear or pale yellow. This will help to thin the earwax.  Keep all follow-up visits as told by your health care provider. If earwax builds up in your ears often or if you use hearing aids, consider seeing your health care provider for routine, preventive ear cleanings. Ask your health care provider how often you should schedule your cleanings.  If you have hearing aids, clean them according to instructions from the manufacturer and your health care provider. Contact a health care provider if:  You have ear pain.  You develop a fever.  You have blood, pus, or other fluid coming from your ear.  You have hearing loss.  You have ringing in your ears that does not go away.  Your symptoms do not improve with treatment.  You feel like the room is spinning (vertigo). Summary  Earwax can build up in the ear and cause discomfort or hearing loss.  The most common symptoms of this condition include reduced or muffled hearing and a feeling of fullness in the ear or feeling that the ear is plugged.  This condition may be diagnosed based on your symptoms, your medical history, and an ear exam.  This condition may be treated by using ear drops to soften the earwax or by having the earwax removed by a health care provider.  Do not put any   objects, including cotton swabs, into your ear. You can clean the opening of your ear canal with a washcloth or facial tissue. This information is not intended to replace advice given to you by your health care provider. Make sure you discuss any questions you have with your health care provider. Document Revised: 11/10/2017 Document Reviewed: 02/08/2017 Elsevier Patient Education  2020 Elsevier Inc.  

## 2020-09-22 NOTE — Assessment & Plan Note (Signed)
I think the sensation of itching and tingling is related to neuropathy.   She will continue on gabapentin for this.

## 2020-09-22 NOTE — Assessment & Plan Note (Signed)
L ear lavage completed today.  F/u as needed for this.

## 2020-10-03 ENCOUNTER — Other Ambulatory Visit: Payer: Self-pay | Admitting: Family Medicine

## 2020-10-15 ENCOUNTER — Ambulatory Visit (INDEPENDENT_AMBULATORY_CARE_PROVIDER_SITE_OTHER): Payer: Medicare Other | Admitting: Obstetrics and Gynecology

## 2020-10-15 ENCOUNTER — Encounter: Payer: Self-pay | Admitting: Obstetrics and Gynecology

## 2020-10-15 ENCOUNTER — Other Ambulatory Visit: Payer: Self-pay

## 2020-10-15 VITALS — BP 142/73 | HR 66 | Resp 16 | Ht 62.0 in | Wt 149.0 lb

## 2020-10-15 DIAGNOSIS — N950 Postmenopausal bleeding: Secondary | ICD-10-CM | POA: Diagnosis not present

## 2020-10-15 MED ORDER — MISOPROSTOL 200 MCG PO TABS
ORAL_TABLET | ORAL | 1 refills | Status: DC
Start: 2020-10-15 — End: 2020-10-29

## 2020-10-15 NOTE — Progress Notes (Signed)
ENDOMETRIAL BIOPSY      Rebecca Austin is a 69 y.o. G2P1011 here for endometrial biopsy.  The indications for endometrial biopsy were reviewed.  Risks of the biopsy including cramping, bleeding, infection, uterine perforation, inadequate specimen and need for additional procedures were discussed. The patient states she understands and agrees to undergo procedure today. Consent was signed. Time out was performed.   Indications: post menopausal bleeding Urine HCG: n/a  A bivalve speculum was placed into the vagina and the cervix was easily visualized and was prepped with Betadine x2. A single-toothed tenaculum was placed on the anterior lip of the cervix to stabilize it. The 3 mm pipelle was attempted to be introduced into the endometrial cavity but was unable to get past external os. The os finder was used without success. Procedure stopped at this point. The instruments were removed from the patient's vagina. Minimal bleeding from the cervix at the tenaculum was noted.   The patient tolerated the procedure well. Routine post-procedure instructions were given to the patient.    Return for biopsy with cytotec.  Baldemar Lenis, M.D. Attending [facility] for [employer] [facility])

## 2020-10-15 NOTE — Progress Notes (Signed)
GYNECOLOGY OFFICE NOTE  History:  69 y.o. G2P1011 here today for post menopausal bleeding. Started out as dark brown and became bright red and mucousy. Started 10/05/20, noticed it mostly with wiping. Also feels like it happens when she strains to empty bladder. Urinary frequency as well.   She was on estrogen/progesterone HRT until recently. She quit cold [place] in 08/2020 and had intense hot flushes/night sweats and restarted them.   LMP 11/2003  Past Medical History:  Diagnosis Date  . DDD (degenerative disc disease), lumbar   . Depression   . IFG (impaired fasting glucose)   . Obesity   . Plantar fasciitis    See's Dr. Ihor Gully  . Thyroid disease   . Tobacco abuse     Past Surgical History:  Procedure Laterality Date  . CESAREAN SECTION    . fluroscopy     LESI/Dr. Yves Dill  . TONSILLECTOMY     adenoids, and uvula removed for sleep apnes  . TUBAL LIGATION       Current Outpatient Medications:  .  amLODipine (NORVASC) 10 MG tablet, TAKE 1 TABLET DAILY, Disp: 90 tablet, Rfl: 1 .  Calcium Citrate-Vitamin D (CALCIUM CITRATE + D) 250-200 MG-UNIT TABS, Take 2 tablets by mouth 2 (two) times daily., Disp: , Rfl:  .  carisoprodol (SOMA) 350 MG tablet, Take 1 tablet (350 mg total) by mouth daily as needed for muscle spasms., Disp: 90 tablet, Rfl: 0 .  estradiol (ESTRACE) 1 MG tablet, TAKE 1 TABLET TWICE A DAY, Disp: 180 tablet, Rfl: 0 .  gabapentin (NEURONTIN) 600 MG tablet, TAKE 2 TABLETS(1200MG       TOTAL) 3 TIMES A DAY, Disp: 540 tablet, Rfl: 3 .  levothyroxine (SYNTHROID) 50 MCG tablet, Take 1 tablet (50 mcg total) by mouth daily. Must have lab work for future refills., Disp: 30 tablet, Rfl: 0 .  losartan (COZAAR) 100 MG tablet, TAKE 1 TABLET DAILY, Disp: 90 tablet, Rfl: 1 .  medroxyPROGESTERone (PROVERA) 2.5 MG tablet, TAKE 1 TABLET BY MOUTH  DAILY, Disp: 90 tablet, Rfl: 2 .  naproxen (NAPROSYN) 500 MG tablet, TAKE 1 TABLET BY MOUTH 2 TIMES DAILY WITH A MEAL., Disp: 180  tablet, Rfl: 1 .  omeprazole (PRILOSEC) 40 MG capsule, TAKE 1 CAPSULE DAILY, Disp: 90 capsule, Rfl: 1 .  ondansetron (ZOFRAN ODT) 4 MG disintegrating tablet, Take 1 tablet (4 mg total) by mouth every 8 (eight) hours as needed for nausea or vomiting., Disp: 20 tablet, Rfl: 0 .  misoprostol (CYTOTEC) 200 MCG tablet, Insert four tablets vaginally 4 hours prior to your appointment, Disp: 4 tablet, Rfl: 1 .  Multiple Vitamins-Minerals (PRESERVISION AREDS PO), Take by mouth., Disp: , Rfl:   The following portions of the patient's history were reviewed and updated as appropriate: allergies, current medications, past family history, past medical history, past social history, past surgical history and problem list.   Review of Systems:  Pertinent items noted in HPI and remainder of comprehensive ROS otherwise negative.   Objective:  Physical Exam BP (!) 142/73   Pulse 66   Resp 16   Ht 5\' 2"  (1.575 m)   Wt 149 lb (67.6 kg)   BMI 27.25 kg/m  CONSTITUTIONAL: Well-developed, well-nourished female in no acute distress.  HENT:  Normocephalic, atraumatic. External right and left ear normal. Oropharynx is clear and moist EYES: Conjunctivae and EOM are normal. Pupils are equal, round, and reactive to light. No scleral icterus.  NECK: Normal range of motion, supple, no masses SKIN: Skin  is warm and dry. No rash noted. Not diaphoretic. No erythema. No pallor. NEUROLOGIC: Alert and oriented to person, place, and time. Normal reflexes, muscle tone coordination. No cranial nerve deficit noted. PSYCHIATRIC: Normal mood and affect. Normal behavior. Normal judgment and thought content. CARDIOVASCULAR: Normal heart rate noted RESPIRATORY: Effort normal, no problems with respiration noted ABDOMEN: Soft, no distention noted.   PELVIC: Normal appearing external genitalia; normal appearing vaginal mucosa and cervix.  No abnormal discharge noted.  Dark red blood coming from external os. EMB attempted, unable to get  through external os, see note MUSCULOSKELETAL: Normal range of motion. No edema noted.  Exam done with chaperone present.  Labs and Imaging No results found.  Assessment & Plan:   1. Post-menopausal bleeding Noted on exam today Recommended EMB, patient agreeable, stenotic cervix and unable to complete EMB, see note Return for EMB with cytotec, sent to pharmacy  Routine preventative health maintenance measures emphasized. Please refer to After Visit Summary for other counseling recommendations.   Return in about 1 week (around 10/22/2020) for EMB.  Total face-to-face time with patient: 35 minutes. Over 50% of encounter was spent on counseling and coordination of care.  Baldemar Lenis, M.D. Attending Center for [facility])

## 2020-10-22 ENCOUNTER — Ambulatory Visit (INDEPENDENT_AMBULATORY_CARE_PROVIDER_SITE_OTHER): Payer: Medicare Other

## 2020-10-22 ENCOUNTER — Ambulatory Visit (INDEPENDENT_AMBULATORY_CARE_PROVIDER_SITE_OTHER): Payer: Medicare Other | Admitting: Obstetrics and Gynecology

## 2020-10-22 ENCOUNTER — Encounter: Payer: Self-pay | Admitting: Family Medicine

## 2020-10-22 ENCOUNTER — Encounter: Payer: Self-pay | Admitting: Obstetrics and Gynecology

## 2020-10-22 ENCOUNTER — Other Ambulatory Visit: Payer: Self-pay

## 2020-10-22 VITALS — BP 118/63 | HR 65 | Ht 62.0 in | Wt 149.0 lb

## 2020-10-22 DIAGNOSIS — N858 Other specified noninflammatory disorders of uterus: Secondary | ICD-10-CM | POA: Diagnosis not present

## 2020-10-22 DIAGNOSIS — Z780 Asymptomatic menopausal state: Secondary | ICD-10-CM | POA: Diagnosis not present

## 2020-10-22 DIAGNOSIS — N950 Postmenopausal bleeding: Secondary | ICD-10-CM

## 2020-10-22 NOTE — Progress Notes (Signed)
ENDOMETRIAL BIOPSY      Rebecca Austin is a 69 y.o. G2P1011 here for endometrial biopsy. Patient previous had failed EMB due to stenotic cervix and returns for biopsy with cytotec.   The indications for endometrial biopsy were reviewed.  Risks of the biopsy including cramping, bleeding, infection, uterine perforation, inadequate specimen and need for additional procedures were discussed. The patient states she understands and agrees to undergo procedure today. Consent was signed. Time out was performed.   Indications: post menopausal bleeding Urine HCG: n/a  A bivalve speculum was placed into the vagina and the cervix was easily visualized and was prepped with Betadine x2. A single-toothed tenaculum was placed on the anterior lip of the cervix to stabilize it. The cervix was attempted to be dilated with cervical dilators and unable to be dilated with any dilator. Several attempts were made with no success. Decision made to halt procedure. The instruments were removed from the patient's vagina. Minimal bleeding from the cervix at the tenaculum was noted.   The patient tolerated the procedure well. Routine post-procedure instructions were given to the patient.    Reviewed options for management including [place], repeat EMB with lacrimal dilators and cytotec, D&C in OR. She opts to start with [place] for assess endometrial stripe. Will base further workup on [place] results.  Baldemar Lenis, M.D. Attending [facility])

## 2020-10-24 ENCOUNTER — Encounter: Payer: Self-pay | Admitting: Family Medicine

## 2020-10-26 ENCOUNTER — Telehealth: Payer: Self-pay | Admitting: *Deleted

## 2020-10-26 ENCOUNTER — Other Ambulatory Visit: Payer: Self-pay | Admitting: *Deleted

## 2020-10-26 MED ORDER — LEVOTHYROXINE SODIUM 50 MCG PO TABS
50.0000 ug | ORAL_TABLET | Freq: Every day | ORAL | 0 refills | Status: DC
Start: 2020-10-26 — End: 2021-01-19

## 2020-10-26 NOTE — Telephone Encounter (Signed)
Patient was informed that her message was forwarded to Dr. Earlene Plater on 10/26/2020 and the office is just waiting to hear back from Dr. Earlene Plater about next steps after U/S. Patient agreed with care plan.

## 2020-10-27 DIAGNOSIS — I10 Essential (primary) hypertension: Secondary | ICD-10-CM | POA: Diagnosis not present

## 2020-10-27 DIAGNOSIS — E039 Hypothyroidism, unspecified: Secondary | ICD-10-CM | POA: Diagnosis not present

## 2020-10-28 LAB — COMPLETE METABOLIC PANEL WITH GFR
AG Ratio: 1.9 (calc) (ref 1.0–2.5)
ALT: 13 U/L (ref 6–29)
AST: 16 U/L (ref 10–35)
Albumin: 4.2 g/dL (ref 3.6–5.1)
Alkaline phosphatase (APISO): 55 U/L (ref 37–153)
BUN: 14 mg/dL (ref 7–25)
CO2: 26 mmol/L (ref 20–32)
Calcium: 9.3 mg/dL (ref 8.6–10.4)
Chloride: 107 mmol/L (ref 98–110)
Creat: 0.86 mg/dL (ref 0.50–0.99)
GFR, Est African American: 81 mL/min/{1.73_m2} (ref >=60)
GFR, Est Non African American: 70 mL/min/{1.73_m2} (ref >=60)
Globulin: 2.2 g/dL (calc) (ref 1.9–3.7)
Glucose, Bld: 88 mg/dL (ref 65–99)
Potassium: 4.1 mmol/L (ref 3.5–5.3)
Sodium: 142 mmol/L (ref 135–146)
Total Bilirubin: 0.5 mg/dL (ref 0.2–1.2)
Total Protein: 6.4 g/dL (ref 6.1–8.1)

## 2020-10-28 LAB — CBC
HCT: 38.3 % (ref 35.0–45.0)
Hemoglobin: 11.9 g/dL (ref 11.7–15.5)
MCH: 23.9 pg — ABNORMAL LOW (ref 27.0–33.0)
MCHC: 31.1 g/dL — ABNORMAL LOW (ref 32.0–36.0)
MCV: 77.1 fL — ABNORMAL LOW (ref 80.0–100.0)
MPV: 10.3 fL (ref 7.5–12.5)
Platelets: 296 10*3/uL (ref 140–400)
RBC: 4.97 10*6/uL (ref 3.80–5.10)
RDW: 16.1 % — ABNORMAL HIGH (ref 11.0–15.0)
WBC: 5.6 10*3/uL (ref 3.8–10.8)

## 2020-10-28 LAB — LIPID PANEL
Cholesterol: 241 mg/dL — ABNORMAL HIGH (ref <200)
HDL: 67 mg/dL (ref >=50)
LDL Cholesterol (Calc): 146 mg/dL (calc) — ABNORMAL HIGH
Non-HDL Cholesterol (Calc): 174 mg/dL (calc) — ABNORMAL HIGH (ref <130)
Total CHOL/HDL Ratio: 3.6 (calc) (ref <5.0)
Triglycerides: 152 mg/dL — ABNORMAL HIGH (ref <150)

## 2020-10-29 ENCOUNTER — Encounter: Payer: Self-pay | Admitting: Obstetrics and Gynecology

## 2020-10-29 ENCOUNTER — Telehealth (INDEPENDENT_AMBULATORY_CARE_PROVIDER_SITE_OTHER): Payer: Medicare Other | Admitting: Obstetrics and Gynecology

## 2020-10-29 ENCOUNTER — Encounter: Payer: Self-pay | Admitting: Family Medicine

## 2020-10-29 ENCOUNTER — Other Ambulatory Visit: Payer: Self-pay

## 2020-10-29 DIAGNOSIS — N950 Postmenopausal bleeding: Secondary | ICD-10-CM | POA: Diagnosis not present

## 2020-10-29 NOTE — Progress Notes (Signed)
GYNECOLOGY VIRTUAL VISIT ENCOUNTER NOTE  Provider location: Center for [facility] [place]   I connected with [name] on 10/29/20 at  1:15 PM EST by MyChart Video Encounter at home and verified that I am speaking with the correct person using two identifiers.   I discussed the limitations, risks, security and privacy concerns of performing an evaluation and management service virtually and the availability of in person appointments. I also discussed with the patient that there may be a patient responsible charge related to this service. The patient expressed understanding and agreed to proceed.   History:  Rebecca Austin is a 69 y.o. G102P1011 female being evaluated today for post menopausal bleeding. Unsuccessful EMB x2 in office.     Past Medical History:  Diagnosis Date  . DDD (degenerative disc disease), lumbar   . Depression   . IFG (impaired fasting glucose)   . Obesity   . Plantar fasciitis    See's Dr. Ihor Gully  . Thyroid disease   . Tobacco abuse    Past Surgical History:  Procedure Laterality Date  . CESAREAN SECTION    . fluroscopy     LESI/Dr. Yves Dill  . TONSILLECTOMY     adenoids, and uvula removed for sleep apnes  . TUBAL LIGATION     The following portions of the patient's history were reviewed and updated as appropriate: allergies, current medications, past family history, past medical history, past social history, past surgical history and problem list.    Review of Systems:  Pertinent items noted in HPI and remainder of comprehensive ROS otherwise negative.  Physical Exam:   General:  Alert, oriented and cooperative. Patient appears to be in no acute distress.  Mental Status: Normal mood and affect. Normal behavior. Normal judgment and thought content.   Respiratory: Normal respiratory effort, no problems with respiration noted  Rest of physical exam deferred due to type of encounter  Labs and Imaging Results for orders  placed or performed in visit on 05/28/20 (from the past 336 hour(s))  COMPLETE METABOLIC PANEL WITH GFR   Collection Time: 10/27/20 12:00 AM  Result Value Ref Range   Glucose, Bld 88 65 - 99 mg/dL   BUN 14 7 - 25 mg/dL   Creat 9.93 7.16 - 9.67 mg/dL   GFR, Est Non African American 70 > OR = 60 mL/min/1.53m2   GFR, Est African American 81 > OR = 60 mL/min/1.37m2   BUN/Creatinine Ratio NOT APPLICABLE 6 - 22 (calc)   Sodium 142 135 - 146 mmol/L   Potassium 4.1 3.5 - 5.3 mmol/L   Chloride 107 98 - 110 mmol/L   CO2 26 20 - 32 mmol/L   Calcium 9.3 8.6 - 10.4 mg/dL   Total Protein 6.4 6.1 - 8.1 g/dL   Albumin 4.2 3.6 - 5.1 g/dL   Globulin 2.2 1.9 - 3.7 g/dL (calc)   AG Ratio 1.9 1.0 - 2.5 (calc)   Total Bilirubin 0.5 0.2 - 1.2 mg/dL   Alkaline phosphatase (APISO) 55 37 - 153 U/L   AST 16 10 - 35 U/L   ALT 13 6 - 29 U/L  Lipid panel   Collection Time: 10/27/20 12:00 AM  Result Value Ref Range   Cholesterol 241 (H) <200 mg/dL   HDL 67 > OR = 50 mg/dL   Triglycerides 893 (H) <150 mg/dL   LDL Cholesterol (Calc) 146 (H) mg/dL (calc)   Total CHOL/HDL Ratio 3.6 <5.0 (calc)   Non-HDL Cholesterol (Calc) 174 (  H) <130 mg/dL (calc)  CBC   Collection Time: 10/27/20 12:00 AM  Result Value Ref Range   WBC 5.6 3.8 - 10.8 Thousand/uL   RBC 4.97 3.80 - 5.10 Million/uL   Hemoglobin 11.9 11.7 - 15.5 g/dL   HCT 07.3 35 - 45 %   MCV 77.1 (L) 80.0 - 100.0 fL   MCH 23.9 (L) 27.0 - 33.0 pg   MCHC 31.1 (L) 32.0 - 36.0 g/dL   RDW 71.0 (H) 62.6 - 94.8 %   Platelets 296 140 - 400 Thousand/uL   MPV 10.3 7.5 - 12.5 fL  TSH   Collection Time: 10/27/20 12:00 AM  Result Value Ref Range   TSH 4.11 0.40 - 4.50 mIU/L  Iron   Collection Time: 10/27/20 12:00 AM  Result Value Ref Range   Iron 43 (L) 45 - 160 mcg/dL   US PELVIC COMPLETE WITH TRANSVAGINAL  Result Date: 10/22/2020 CLINICAL DATA:  Postmenopausal bleeding for 3 weeks, unsuccessful attempt at endometrial biopsy today due to cervical stenosis,  past history of Caesarean section and tubal ligation EXAM: TRANSABDOMINAL AND TRANSVAGINAL ULTRASOUND OF PELVIS TECHNIQUE: Both transabdominal and transvaginal ultrasound examinations of the pelvis were performed. Transabdominal technique was performed for global imaging of the pelvis including uterus, ovaries, adnexal regions, and pelvic cul-de-sac. It was necessary to proceed with endovaginal exam following the transabdominal exam to visualize the endometrium. COMPARISON:  04/29/2015 FINDINGS: Uterus Measurements: 6.3 x 3.1 x 4.5 cm = volume: 46 mL. Anteverted. Slightly heterogeneous myometrium. Anterior wall Caesarean section scar. No focal mass. Endometrium Thickness: 12 mm. Suboptimally delineated, approximately 9 mm thick. Heterogeneous. Few scattered calcifications. Small endometrial cyst at the level of the Caesarean section scar. No mass or fluid. Right ovary Not visualized, likely obscured by bowel Left ovary Not visualized, likely obscured by bowel Other findings No free pelvic fluid.  No adnexal masses. IMPRESSION: Nonvisualization of ovaries. Endometrial complex is heterogeneous and suboptimally delineated, approximately 9 mm thick; endometrial thickness is considered abnormal for an asymptomatic post-menopausal female. Endometrial sampling should be considered to exclude carcinoma. Electronically Signed   By: Ulyses Southward M.D.   On: 10/22/2020 13:51       Assessment and Plan:   1. Post-menopausal bleeding - Unable to sample in office via EMB x2 - TVUS with endometrial lining 9 mm, need definitive sampling - Recommend D&C hysteroscopy, patient is agreeable - For first available D&C hysteroscopy - Reviewed pre-op instructions, expected post-op course. She understands she will need to be NPO after midnight the night prior to the procedure. She understands she will have a PAT visit. She understands she will be notified by the administrative scheduler of scheduled date/time for the above. Answered  all questions, she will call with any issues. - understands she will be cancelled for +COVID test   I discussed the assessment and treatment plan with the patient. The patient was provided an opportunity to ask questions and all were answered. The patient agreed with the plan and demonstrated an understanding of the instructions.   The patient was advised to call back or seek an in-person evaluation/go to the ED if the symptoms worsen or if the condition fails to improve as anticipated.  I provided 15 minutes of face-to-face time during this encounter.   Conan Bowens, MD Center for [facility], [facility]

## 2020-10-30 LAB — IRON: Iron: 43 ug/dL — ABNORMAL LOW (ref 45–160)

## 2020-10-30 LAB — TSH: TSH: 4.11 mIU/L (ref 0.40–4.50)

## 2020-10-30 LAB — FERRITIN: Ferritin: 4 ng/mL — ABNORMAL LOW (ref 16–288)

## 2020-11-07 ENCOUNTER — Other Ambulatory Visit: Payer: Self-pay | Admitting: Family Medicine

## 2020-11-07 ENCOUNTER — Other Ambulatory Visit: Payer: Self-pay | Admitting: Sports Medicine

## 2020-11-07 DIAGNOSIS — M79651 Pain in right thigh: Secondary | ICD-10-CM

## 2020-11-10 ENCOUNTER — Other Ambulatory Visit: Payer: Self-pay

## 2020-11-10 DIAGNOSIS — M48061 Spinal stenosis, lumbar region without neurogenic claudication: Secondary | ICD-10-CM

## 2020-11-13 MED ORDER — CARISOPRODOL 350 MG PO TABS: 350.0000 mg | ORAL_TABLET | Freq: Every day | ORAL | 0 refills | Status: DC | PRN

## 2020-11-19 ENCOUNTER — Other Ambulatory Visit: Payer: Self-pay | Admitting: Obstetrics and Gynecology

## 2020-11-19 MED ORDER — MISOPROSTOL 200 MCG PO TABS: ORAL_TABLET | ORAL | 1 refills | Status: AC

## 2020-11-30 ENCOUNTER — Encounter (HOSPITAL_BASED_OUTPATIENT_CLINIC_OR_DEPARTMENT_OTHER): Payer: Self-pay | Admitting: Obstetrics and Gynecology

## 2020-11-30 ENCOUNTER — Other Ambulatory Visit: Payer: Self-pay

## 2020-12-01 ENCOUNTER — Encounter (HOSPITAL_BASED_OUTPATIENT_CLINIC_OR_DEPARTMENT_OTHER)
Admission: RE | Admit: 2020-12-01 | Discharge: 2020-12-01 | Disposition: A | Discharge status: Home / Self Care | Payer: Medicare Other | Source: Ambulatory Visit | Attending: Obstetrics and Gynecology | Admitting: Obstetrics and Gynecology

## 2020-12-01 ENCOUNTER — Encounter: Payer: Self-pay | Admitting: Family Medicine

## 2020-12-01 DIAGNOSIS — Z01812 Encounter for preprocedural laboratory examination: Secondary | ICD-10-CM | POA: Diagnosis not present

## 2020-12-01 LAB — BASIC METABOLIC PANEL
Anion gap: 9 (ref 5–15)
BUN: 18 mg/dL (ref 8–23)
CO2: 24 mmol/L (ref 22–32)
Calcium: 8.9 mg/dL (ref 8.9–10.3)
Chloride: 105 mmol/L (ref 98–111)
Creatinine, Ser: 0.88 mg/dL (ref 0.44–1.00)
GFR, Estimated: >60 mL/min (ref >60)
Glucose, Bld: 85 mg/dL (ref 70–99)
Potassium: 4.3 mmol/L (ref 3.5–5.1)
Sodium: 138 mmol/L (ref 135–145)

## 2020-12-01 LAB — TYPE AND SCREEN
ABO/RH(D): O POS
Antibody Screen: NEGATIVE

## 2020-12-01 LAB — CBC
HCT: 41.4 % (ref 36.0–46.0)
Hemoglobin: 12.8 g/dL (ref 12.0–15.0)
MCH: 24.5 pg — ABNORMAL LOW (ref 26.0–34.0)
MCHC: 30.9 g/dL (ref 30.0–36.0)
MCV: 79.3 fL — ABNORMAL LOW (ref 80.0–100.0)
Platelets: 287 10*3/uL (ref 150–400)
RBC: 5.22 MIL/uL — ABNORMAL HIGH (ref 3.87–5.11)
RDW: 16.4 % — ABNORMAL HIGH (ref 11.5–15.5)
WBC: 6.4 10*3/uL (ref 4.0–10.5)
nRBC: 0 % (ref 0.0–0.2)

## 2020-12-01 NOTE — Progress Notes (Signed)

## 2020-12-04 ENCOUNTER — Telehealth: Payer: Self-pay | Admitting: Obstetrics and Gynecology

## 2020-12-04 NOTE — Telephone Encounter (Signed)
Called patient, verified name/DOB. Surgery cancelled, she will be rescheduled. Pt verbalized understanding.    Baldemar Lenis, M.D. Attending [facility])

## 2020-12-07 ENCOUNTER — Other Ambulatory Visit (HOSPITAL_COMMUNITY): Payer: PRIVATE HEALTH INSURANCE

## 2020-12-08 ENCOUNTER — Ambulatory Visit (HOSPITAL_BASED_OUTPATIENT_CLINIC_OR_DEPARTMENT_OTHER)
Admission: RE | Admit: 2020-12-08 | Payer: Medicare Other | Source: Home / Self Care | Admitting: Obstetrics and Gynecology

## 2020-12-08 SURGERY — DILATATION AND CURETTAGE /HYSTEROSCOPY
Anesthesia: Choice

## 2020-12-15 ENCOUNTER — Other Ambulatory Visit: Payer: Self-pay

## 2020-12-15 ENCOUNTER — Ambulatory Visit (INDEPENDENT_AMBULATORY_CARE_PROVIDER_SITE_OTHER): Payer: Medicare Other | Admitting: Sports Medicine

## 2020-12-15 ENCOUNTER — Ambulatory Visit (INDEPENDENT_AMBULATORY_CARE_PROVIDER_SITE_OTHER): Payer: Medicare Other

## 2020-12-15 ENCOUNTER — Encounter: Payer: Self-pay | Admitting: Family Medicine

## 2020-12-15 DIAGNOSIS — M48061 Spinal stenosis, lumbar region without neurogenic claudication: Secondary | ICD-10-CM | POA: Diagnosis not present

## 2020-12-15 DIAGNOSIS — M542 Cervicalgia: Secondary | ICD-10-CM | POA: Diagnosis not present

## 2020-12-15 DIAGNOSIS — M546 Pain in thoracic spine: Secondary | ICD-10-CM

## 2020-12-15 DIAGNOSIS — M5030 Other cervical disc degeneration, unspecified cervical region: Secondary | ICD-10-CM | POA: Diagnosis not present

## 2020-12-15 DIAGNOSIS — I700 Atherosclerosis of aorta: Secondary | ICD-10-CM | POA: Diagnosis not present

## 2020-12-15 DIAGNOSIS — M4802 Spinal stenosis, cervical region: Secondary | ICD-10-CM

## 2020-12-15 DIAGNOSIS — M5136 Other intervertebral disc degeneration, lumbar region: Secondary | ICD-10-CM | POA: Diagnosis not present

## 2020-12-15 DIAGNOSIS — M545 Low back pain, unspecified: Secondary | ICD-10-CM | POA: Diagnosis not present

## 2020-12-15 MED ORDER — PREDNISONE 50 MG PO TABS: ORAL_TABLET | ORAL | 0 refills | Status: DC

## 2020-12-15 NOTE — Assessment & Plan Note (Signed)
This 70 year old female also has chronic low back pain, known lumbar spinal stenosis from L3-L5, Soma, gabapentin, naproxen controlled this adequately, she also had improvement with the epidural last year. Prednisone as above, x-rays to allay her fears regarding potential metastatic endometrial cancer. I like to see her back in about a month and we can proceed with MRI for additional intervention if no better.

## 2020-12-15 NOTE — Assessment & Plan Note (Signed)
This is a pleasant 70 year old female, she has known cervical DDD, this responded well to conservative treatment about a year ago. Now she is having recurrence of pain, in the neck and periscapular region. She is a bit worried well as her mother died of breast cancer metastatic to bone, she does have a thick endometrial stripe and has endometrial biopsy coming up. She is concerned that she may have metastatic disease causing the pain in her neck and her low back. Happy to allay her fears with a cervical and thoracic x-ray, we will hit her with a burst of prednisone and she can continue her home rehab exercises, if insufficient improvement we will proceed with MRI of her cervical spine.

## 2020-12-15 NOTE — Progress Notes (Signed)
    Procedures performed today:    None.  Independent interpretation of notes and tests performed by another provider:   None.  Brief History, Exam, Impression, and Recommendations:    DDD (degenerative disc disease), cervical This is a pleasant 70 year old female, she has known cervical DDD, this responded well to conservative treatment about a year ago. Now she is having recurrence of pain, in the neck and periscapular region. She is a bit worried well as her mother died of breast cancer metastatic to bone, she does have a thick endometrial stripe and has endometrial biopsy coming up. She is concerned that she may have metastatic disease causing the pain in her neck and her low back. Happy to allay her fears with a cervical and thoracic x-ray, we will hit her with a burst of prednisone and she can continue her home rehab exercises, if insufficient improvement we will proceed with MRI of her cervical spine.  Lumbar spinal stenosis This 70 year old female also has chronic low back pain, known lumbar spinal stenosis from L3-L5, Soma, gabapentin, naproxen controlled this adequately, she also had improvement with the epidural last year. Prednisone as above, x-rays to allay her fears regarding potential metastatic endometrial cancer. I like to see her back in about a month and we can proceed with MRI for additional intervention if no better.    ___________________________________________ [name]. Benjamin Stain, M.D., ABFM., CAQSM. Primary Care and Sports Medicine [facility]  Adjunct Instructor of Family Medicine  [employer]

## 2020-12-23 ENCOUNTER — Telehealth: Payer: Self-pay

## 2020-12-23 MED ORDER — TRAMADOL HCL 50 MG PO TABS
50.0000 mg | ORAL_TABLET | Freq: Three times a day (TID) | ORAL | 0 refills | Status: DC | PRN
Start: 2020-12-23 — End: 2021-01-12

## 2020-12-23 NOTE — Telephone Encounter (Signed)
Patient aware Tramadol has been sent to the pharmacy.

## 2020-12-23 NOTE — Telephone Encounter (Signed)
Rebecca Austin called and left a message stating the prednisone has not helped with the back pain. She has a follow up on February the 1 st. She would like something for pain to get her to this appointment. Please advise.

## 2020-12-23 NOTE — Telephone Encounter (Signed)
Adding tramadol. 

## 2020-12-26 ENCOUNTER — Other Ambulatory Visit: Payer: Self-pay | Admitting: Family Medicine

## 2020-12-26 ENCOUNTER — Other Ambulatory Visit (HOSPITAL_COMMUNITY)
Admission: RE | Admit: 2020-12-26 | Discharge: 2020-12-26 | Disposition: A | Discharge status: Home / Self Care | Payer: Medicare Other | Source: Ambulatory Visit | Attending: Obstetrics and Gynecology | Admitting: Obstetrics and Gynecology

## 2020-12-26 DIAGNOSIS — Z01812 Encounter for preprocedural laboratory examination: Secondary | ICD-10-CM | POA: Insufficient documentation

## 2020-12-26 DIAGNOSIS — Z20822 Contact with and (suspected) exposure to covid-19: Secondary | ICD-10-CM | POA: Insufficient documentation

## 2020-12-26 LAB — SARS CORONAVIRUS 2 (TAT 6-24 HRS): SARS Coronavirus 2: NEGATIVE

## 2020-12-29 ENCOUNTER — Encounter (HOSPITAL_BASED_OUTPATIENT_CLINIC_OR_DEPARTMENT_OTHER): Payer: Self-pay | Admitting: Obstetrics and Gynecology

## 2020-12-29 ENCOUNTER — Other Ambulatory Visit: Payer: Self-pay

## 2020-12-29 NOTE — Progress Notes (Signed)
Spoke w/ via phone for pre-op interview--- PT Lab needs dos----  Istat (per anes)/  Pre-op orders pending            Lab results------ current ekg in epic/ chart COVID test ------ done 12-26-2020 negative result in epic Arrive at ------- 1315 NPO after MN NO Solid Food.  Clear liquids from MN until---  1215 Medications to take morning of surgery ----- Synthroid, Prilosec Diabetic medication ----- n/a Patient Special Instructions ----- n/a Pre-Op special Istructions ----- sent inbox message to dr Earlene Plater, requested pre-op orders Patient verbalized understanding of instructions that were given at this phone interview. Patient denies shortness of breath, chest pain, fever, cough at this phone interview.

## 2020-12-30 ENCOUNTER — Ambulatory Visit (HOSPITAL_BASED_OUTPATIENT_CLINIC_OR_DEPARTMENT_OTHER): Payer: Medicare Other | Admitting: Anesthesiology

## 2020-12-30 ENCOUNTER — Encounter (HOSPITAL_BASED_OUTPATIENT_CLINIC_OR_DEPARTMENT_OTHER)
Admission: RE | Disposition: A | Discharge status: Home / Self Care | Payer: Self-pay | Source: Home / Self Care | Attending: Obstetrics and Gynecology

## 2020-12-30 ENCOUNTER — Other Ambulatory Visit: Payer: Self-pay

## 2020-12-30 ENCOUNTER — Encounter (HOSPITAL_BASED_OUTPATIENT_CLINIC_OR_DEPARTMENT_OTHER): Payer: Self-pay | Admitting: Obstetrics and Gynecology

## 2020-12-30 ENCOUNTER — Ambulatory Visit (HOSPITAL_BASED_OUTPATIENT_CLINIC_OR_DEPARTMENT_OTHER)
Admission: RE | Admit: 2020-12-30 | Discharge: 2020-12-30 | Disposition: A | Discharge status: Home / Self Care | Payer: Medicare Other | Attending: Obstetrics and Gynecology | Admitting: Obstetrics and Gynecology

## 2020-12-30 DIAGNOSIS — Z5329 Procedure and treatment not carried out because of patient's decision for other reasons: Secondary | ICD-10-CM | POA: Diagnosis not present

## 2020-12-30 DIAGNOSIS — Z885 Allergy status to narcotic agent status: Secondary | ICD-10-CM | POA: Diagnosis not present

## 2020-12-30 DIAGNOSIS — Z9851 Tubal ligation status: Secondary | ICD-10-CM | POA: Diagnosis not present

## 2020-12-30 DIAGNOSIS — Z87891 Personal history of nicotine dependence: Secondary | ICD-10-CM | POA: Insufficient documentation

## 2020-12-30 DIAGNOSIS — Z880 Allergy status to penicillin: Secondary | ICD-10-CM | POA: Diagnosis not present

## 2020-12-30 DIAGNOSIS — N950 Postmenopausal bleeding: Secondary | ICD-10-CM | POA: Insufficient documentation

## 2020-12-30 DIAGNOSIS — Z79899 Other long term (current) drug therapy: Secondary | ICD-10-CM | POA: Insufficient documentation

## 2020-12-30 DIAGNOSIS — Z79890 Hormone replacement therapy (postmenopausal): Secondary | ICD-10-CM | POA: Diagnosis not present

## 2020-12-30 HISTORY — DX: Spinal stenosis, lumbar region without neurogenic claudication: M48.061

## 2020-12-30 HISTORY — DX: Hypothyroidism, unspecified: E03.9

## 2020-12-30 HISTORY — DX: Essential (primary) hypertension: I10

## 2020-12-30 HISTORY — DX: Prediabetes: R73.03

## 2020-12-30 HISTORY — DX: Other cervical disc degeneration, unspecified cervical region: M50.30

## 2020-12-30 HISTORY — DX: Unspecified osteoarthritis, unspecified site: M19.90

## 2020-12-30 HISTORY — DX: Presence of external hearing-aid: Z97.4

## 2020-12-30 HISTORY — DX: Presence of spectacles and contact lenses: Z97.3

## 2020-12-30 HISTORY — DX: Obstructive sleep apnea (adult) (pediatric): G47.33

## 2020-12-30 HISTORY — DX: Gastro-esophageal reflux disease without esophagitis: K21.9

## 2020-12-30 HISTORY — DX: Other constipation: K59.09

## 2020-12-30 HISTORY — DX: Unspecified macular degeneration: H35.30

## 2020-12-30 HISTORY — DX: Postmenopausal bleeding: N95.0

## 2020-12-30 HISTORY — DX: Iron deficiency anemia, unspecified: D50.9

## 2020-12-30 LAB — BASIC METABOLIC PANEL
Anion gap: 9 (ref 5–15)
BUN: 16 mg/dL (ref 8–23)
CO2: 23 mmol/L (ref 22–32)
Calcium: 8.8 mg/dL — ABNORMAL LOW (ref 8.9–10.3)
Chloride: 105 mmol/L (ref 98–111)
Creatinine, Ser: 0.88 mg/dL (ref 0.44–1.00)
GFR, Estimated: >60 mL/min (ref >60)
Glucose, Bld: 107 mg/dL — ABNORMAL HIGH (ref 70–99)
Potassium: 3.8 mmol/L (ref 3.5–5.1)
Sodium: 137 mmol/L (ref 135–145)

## 2020-12-30 LAB — CBC
HCT: 41.9 % (ref 36.0–46.0)
Hemoglobin: 13.6 g/dL (ref 12.0–15.0)
MCH: 26.1 pg (ref 26.0–34.0)
MCHC: 32.5 g/dL (ref 30.0–36.0)
MCV: 80.4 fL (ref 80.0–100.0)
Platelets: 234 10*3/uL (ref 150–400)
RBC: 5.21 MIL/uL — ABNORMAL HIGH (ref 3.87–5.11)
RDW: 17 % — ABNORMAL HIGH (ref 11.5–15.5)
WBC: 8.7 10*3/uL (ref 4.0–10.5)
nRBC: 0 % (ref 0.0–0.2)

## 2020-12-30 SURGERY — DILATATION AND CURETTAGE /HYSTEROSCOPY
Anesthesia: General

## 2020-12-30 MED ORDER — LACTATED RINGERS IV SOLN: INTRAVENOUS | Status: DC

## 2020-12-30 MED ORDER — PROPOFOL 10 MG/ML IV BOLUS
INTRAVENOUS | Status: AC
  Filled 2020-12-30: qty 20

## 2020-12-30 MED ORDER — ONDANSETRON HCL 4 MG/2ML IJ SOLN
INTRAMUSCULAR | Status: AC
  Filled 2020-12-30: qty 2

## 2020-12-30 MED ORDER — FENTANYL CITRATE (PF) 100 MCG/2ML IJ SOLN
INTRAMUSCULAR | Status: AC
  Filled 2020-12-30: qty 2

## 2020-12-30 MED ORDER — MIDAZOLAM HCL 2 MG/2ML IJ SOLN
INTRAMUSCULAR | Status: AC
  Filled 2020-12-30: qty 2

## 2020-12-30 MED ORDER — SOD CITRATE-CITRIC ACID 500-334 MG/5ML PO SOLN: 30.0000 mL | ORAL | Status: DC

## 2020-12-30 MED ORDER — ACETAMINOPHEN 500 MG PO TABS
ORAL_TABLET | ORAL | Status: AC
  Filled 2020-12-30: qty 2

## 2020-12-30 MED ORDER — DEXAMETHASONE SODIUM PHOSPHATE 10 MG/ML IJ SOLN
INTRAMUSCULAR | Status: AC
  Filled 2020-12-30: qty 1

## 2020-12-30 MED ORDER — POVIDONE-IODINE 10 % EX SWAB: 2.0000 "application " | Freq: Once | CUTANEOUS | Status: DC

## 2020-12-30 MED ORDER — LIDOCAINE HCL (PF) 2 % IJ SOLN
INTRAMUSCULAR | Status: AC
  Filled 2020-12-30: qty 5

## 2020-12-30 MED ORDER — KETOROLAC TROMETHAMINE 30 MG/ML IJ SOLN
INTRAMUSCULAR | Status: AC
  Filled 2020-12-30: qty 1

## 2020-12-30 MED ORDER — ACETAMINOPHEN 500 MG PO TABS
1000.0000 mg | ORAL_TABLET | Freq: Once | ORAL | Status: AC
  Administered 2020-12-30: 1000 mg via ORAL

## 2020-12-30 SURGICAL SUPPLY — 15 items
CATH ROBINSON RED A/P 16FR (CATHETERS) ×1 IMPLANT
DEVICE MYOSURE LITE (MISCELLANEOUS) IMPLANT
DEVICE MYOSURE REACH (MISCELLANEOUS) IMPLANT
GLOVE ORTHOPEDIC STR SZ6.5 (GLOVE) ×1 IMPLANT
GLOVE SURG UNDER POLY LF SZ6.5 (GLOVE) ×1 IMPLANT
GLOVE SURG UNDER POLY LF SZ7 (GLOVE) ×1 IMPLANT
GOWN STRL REUS W/ TWL LRG LVL3 (GOWN DISPOSABLE) ×2 IMPLANT
GOWN STRL REUS W/TWL LRG LVL3 (GOWN DISPOSABLE)
HIBICLENS CHG 4% 4OZ (MISCELLANEOUS) ×1 IMPLANT
KIT PROCEDURE FLUENT (KITS) ×1 IMPLANT
KIT TURNOVER CYSTO (KITS) ×1 IMPLANT
PACK VAGINAL MINOR WOMEN LF (CUSTOM PROCEDURE TRAY) ×1 IMPLANT
PAD OB MATERNITY 4.3X12.25 (PERSONAL CARE ITEMS) ×1 IMPLANT
SEAL ROD LENS SCOPE MYOSURE (ABLATOR) ×1 IMPLANT
TOWEL OR 17X26 10 PK STRL BLUE (TOWEL DISPOSABLE) ×2 IMPLANT

## 2020-12-30 NOTE — Progress Notes (Signed)
Patient called Secretary, Mellody Dance, stating that she left her wedding ring. Patient requested that her ring be mailed to her. Offered to have it at the desk if she wanted to pick it up and she said that she's not coming back and to mail it.

## 2020-12-30 NOTE — Progress Notes (Signed)
Pt angry and upset about wait time for her surgery. She requested that her IV be removed and surgery cancelled, stating "I'm leaving". Refused any information offered about surgery time, or any other information to help explain. Apologies given and Patient stated it's not your fault. Spouse notified per patient request. Before IV d/c asked if there was any chance that she would change her mind and she stated "no".  Dr. Bradley Ferris and Dr. Earlene Plater notified. Dr. Earlene Plater by to speak with patient. Patient left accompanied by her spouse.

## 2020-12-30 NOTE — Anesthesia Preprocedure Evaluation (Deleted)
Anesthesia Evaluation  Patient identified by MRN, date of birth, ID band Patient awake    Reviewed: Allergy & Precautions, NPO status , Patient's Chart, lab work & pertinent test results  Airway Mallampati: III  TM Distance: >3 FB Neck ROM: Full    Dental no notable dental hx.    Pulmonary sleep apnea , former smoker,    Pulmonary exam normal breath sounds clear to auscultation       Cardiovascular hypertension, Pt. on medications Normal cardiovascular exam Rhythm:Regular Rate:Normal     Neuro/Psych PSYCHIATRIC DISORDERS Depression  Neuromuscular disease    GI/Hepatic Neg liver ROS, GERD  Medicated and Controlled,  Endo/Other  Hypothyroidism   Renal/GU negative Renal ROS     Musculoskeletal  (+) Arthritis , Lumbar stenosis   Abdominal   Peds  Hematology negative hematology ROS (+)   Anesthesia Other Findings PMB  Reproductive/Obstetrics                             Anesthesia Physical Anesthesia Plan  ASA: II  Anesthesia Plan: General   Post-op Pain Management:    Induction: Intravenous  PONV Risk Score and Plan: 4 or greater and Ondansetron, Dexamethasone, Midazolam and Treatment may vary due to age or medical condition  Airway Management Planned: LMA  Additional Equipment:   Intra-op Plan:   Post-operative Plan: Extubation in OR  Informed Consent: I have reviewed the patients History and Physical, chart, labs and discussed the procedure including the risks, benefits and alternatives for the proposed anesthesia with the patient or authorized representative who has indicated his/her understanding and acceptance.     Dental advisory given  Plan Discussed with: CRNA  Anesthesia Plan Comments:         Anesthesia Quick Evaluation

## 2020-12-30 NOTE — H&P (Signed)
OB/GYN Pre-Op History and Physical  Rebecca Austin is a 70 y.o. G2P1011 presenting for hysteroscopy, dilation and curettage. Failed endometrial biopsy in office with post menopausal bleeding. Bleeding has stopped, lasted about weeks total. Back pain but no other new issues.       Past Medical History:  Diagnosis Date   Chronic constipation    DDD (degenerative disc disease), cervical    DDD (degenerative disc disease), lumbar    Depression    GERD (gastroesophageal reflux disease)    Hypertension    followed by pcp   Hypothyroidism    followed by pcp   IDA (iron deficiency anemia)    Lumbar stenosis    Macular degeneration of both eyes    Mild obstructive sleep apnea    per pt had OSA surgery (UPPP w/ T&A) in 2000 and stated retested after surgery told mild osa and no cpap recommended   OA (osteoarthritis)    Plantar fasciitis    See's Dr. Ihor Gully   PMB (postmenopausal bleeding)    PMB (postmenopausal bleeding)    Pre-diabetes    Wears glasses    Wears hearing aid in both ears     Past Surgical History:  Procedure Laterality Date   CESAREAN SECTION  1974   W/  BILATERAL TUBAL LIGATION   HEMORRHOID SURGERY  1994   UVULOPALATOPHARYNGOPLASTY  2001   W/  TONSILLECTOMY AND ADENOIDECTOMY    OB History  Gravida Para Term Preterm AB Living  2 1 1   1 1   SAB IAB Ectopic Multiple Live Births               # Outcome Date GA Lbr Len/2nd Weight Sex Delivery Anes PTL Lv  2 Term           1 AB             Social History   Socioeconomic History   Marital status: Married    Spouse name: Not on file   Number of children: 1   Years of education: Not on file   Highest education level: Not on file  Occupational History   Occupation: REtired   Tobacco Use   Smoking status: Former Smoker    Years: 30.00    Types: Cigarettes    Quit date: 12/12/2009    Years since quitting: 11.0   Smokeless tobacco: Never Used  Vaping Use    Vaping Use: Never used  Substance and Sexual Activity   Alcohol use: Not Currently   Drug use: Never   Sexual activity: Not on file    Comment: 2nd marriage,  54 yr old son in prison in [place] and gets out 07, walks daily.  Other Topics Concern   Not on file  Social History Narrative   Working out regulary 3 times per week.    Social Determinants of Health   Financial Resource Strain: Not on file  Food Insecurity: Not on file  Transportation Needs: Not on file  Physical Activity: Not on file  Stress: Not on file  Social Connections: Not on file    Family History  Problem Relation Age of Onset   Osteoporosis Sister    Hypertension Father    Depression Father    Breast cancer Mother 3       postmenopausal breast CA   Diabetes Maternal Grandfather     Medications Prior to Admission  Medication Sig Dispense Refill Last Dose   amLODipine (NORVASC) 10 MG tablet TAKE  1 TABLET DAILY (Patient taking differently: Take 10 mg by mouth at bedtime.) 90 tablet 1 12/29/2020 at Unknown time   Calcium Citrate-Vitamin D 250-200 MG-UNIT TABS Take 2 tablets by mouth 2 (two) times daily.   12/29/2020 at Unknown time   carisoprodol (SOMA) 350 MG tablet Take 1 tablet (350 mg total) by mouth daily as needed for muscle spasms. 90 tablet 0 12/29/2020 at Unknown time   estradiol (ESTRACE) 1 MG tablet TAKE 1 TABLET TWICE A DAY 180 tablet 0 12/30/2020 at 0900   gabapentin (NEURONTIN) 600 MG tablet TAKE 2 TABLETS(1200MG       TOTAL) 3 TIMES A DAY (Patient taking differently: Take 1,200 mg by mouth 3 (three) times daily. TAKE 2 TABLETS  3 TIMES A DAY  (PER PT TAKES AT 1800 , 2000,  0200)) 540 tablet 3 12/30/2020 at 0200   Lactobacillus (FLORANEX PO) Take by mouth daily. PLANT BASED IRON SUPPLEMENT   12/29/2020 at Unknown time   levothyroxine (SYNTHROID) 50 MCG tablet Take 1 tablet (50 mcg total) by mouth daily. 90 tablet 0 12/29/2020 at Unknown time   losartan (COZAAR) 100 MG tablet TAKE 1  TABLET DAILY 90 tablet 1 12/29/2020 at Unknown time   medroxyPROGESTERone (PROVERA) 2.5 MG tablet TAKE 1 TABLET BY MOUTH  DAILY 90 tablet 2 12/29/2020 at Unknown time   misoprostol (CYTOTEC) 200 MCG tablet Place four tablets in between your gums and cheeks (two tablets on each side) as instructed the night prior to your appointment 4 tablet 1 12/30/2020 at Unknown time   naproxen (NAPROSYN) 500 MG tablet TAKE 1 TABLET BY MOUTH 2 TIMES DAILY WITH A MEAL. (Patient taking differently: Take 500 mg by mouth 2 (two) times daily with a meal.) 180 tablet 1 12/29/2020 at Unknown time   omeprazole (PRILOSEC) 40 MG capsule TAKE 1 CAPSULE DAILY (Patient taking differently: Take 40 mg by mouth daily. TAKE 1 CAPSULE DAILY) 90 capsule 1 12/30/2020 at 0900   ondansetron (ZOFRAN ODT) 4 MG disintegrating tablet Take 1 tablet (4 mg total) by mouth every 8 (eight) hours as needed for nausea or vomiting. 20 tablet 0 12/28/2020   Polyethylene Glycol 3350 (MIRALAX PO) Take by mouth as needed.   12/29/2020 at Unknown time   traMADol (ULTRAM) 50 MG tablet Take 1-2 tablets (50-100 mg total) by mouth every 8 (eight) hours as needed for moderate pain. Maximum 6 tabs per day. 21 tablet 0 12/29/2020 at Unknown time   Multiple Vitamins-Minerals (ICAPS AREDS 2 PO) Take by mouth daily.   Unknown at Unknown time    Allergies  Allergen Reactions   Codeine Rash   Hydrochlorothiazide Other (See Comments)    Hyponatremia    Penicillins Rash    Review of Systems: Negative except for what is mentioned in HPI.     Physical Exam: BP 126/60    Pulse 66    Temp 98.8 F (37.1 C) (Oral)    Resp 15    Ht 5\' 1"  (1.549 m)    Wt 68.1 kg    SpO2 100%    BMI 28.38 kg/m  CONSTITUTIONAL: Well-developed, well-nourished female in no acute distress.  HENT:  Normocephalic, atraumatic, External right and left ear normal. Oropharynx is clear and moist EYES: Conjunctivae and EOM are normal. Pupils are equal, round, and reactive to light. No  scleral icterus.  NECK: Normal range of motion, supple, no masses SKIN: Skin is warm and dry. No rash noted. Not diaphoretic. No erythema. No pallor. NEUROLGIC: Alert and oriented  to person, place, and time. Normal reflexes, muscle tone coordination. No cranial nerve deficit noted. PSYCHIATRIC: Normal mood and affect. Normal behavior. Normal judgment and thought content. CARDIOVASCULAR: Normal heart rate noted RESPIRATORY: Effort normal, no problems with respiration noted ABDOMEN: Soft, nontender, nondistended PELVIC: Deferred MUSCULOSKELETAL: Normal range of motion. No edema and no tenderness. 2+ distal pulses.   Pertinent Labs/Studies:   Results for orders placed or performed during the hospital encounter of 12/30/20 (from the past 72 hour(s))  CBC     Status: Abnormal   Collection Time: 12/30/20 12:05 PM  Result Value Ref Range   WBC 8.7 4.0 - 10.5 K/uL   RBC 5.21 (H) 3.87 - 5.11 MIL/uL   Hemoglobin 13.6 12.0 - 15.0 g/dL   HCT 42.6 83.4 - 19.6 %   MCV 80.4 80.0 - 100.0 fL   MCH 26.1 26.0 - 34.0 pg   MCHC 32.5 30.0 - 36.0 g/dL   RDW 22.2 (H) 97.9 - 89.2 %   Platelets 234 150 - 400 K/uL   nRBC 0.0 0.0 - 0.2 %    Comment: Performed at [facility], [address], [place], [place] [address]  Basic metabolic panel     Status: Abnormal   Collection Time: 12/30/20 12:05 PM  Result Value Ref Range   Sodium 137 135 - 145 mmol/L   Potassium 3.8 3.5 - 5.1 mmol/L   Chloride 105 98 - 111 mmol/L   CO2 23 22 - 32 mmol/L   Glucose, Bld 107 (H) 70 - 99 mg/dL    Comment: Glucose reference range applies only to samples taken after fasting for at least 8 hours.   BUN 16 8 - 23 mg/dL   Creatinine, Ser 7.40 0.44 - 1.00 mg/dL   Calcium 8.8 (L) 8.9 - 10.3 mg/dL   GFR, Estimated >81 >44 mL/min    Comment: (NOTE) Calculated using the CKD-EPI Creatinine Equation (2021)    Anion gap 9 5 - 15    Comment: Performed at [facility], [address]  [place], [place] [address]       Assessment and Plan :Rebecca Austin is a 70 y.o. G2P1011 here for hysteroscopy, dilation and curettage. Failed endometrial biopsy in office due to stenosis, recommended due to post menopausal bleeding.  The risks of hysteroscopic D&C were reviewed with the patient; including but not limited to: infection which may require antibiotics; bleeding which may require transfusion or re-operation; injury to bowel, bladder, ureters or other surrounding organs; need for additional procedures including hysterectomy in the event of a life-threatening hemorrhage; thromboembolic phenomenon and other postoperative/anesthesia complications. Reviewed that all sampling will be sent to pathology and further management based on findings. The patient concurred with the proposed plan, giving informed consent for the procedure. She is agreeable to a blood transfusion in the event of emergency.  Patient is NPO Anesthesia aware SCDs  Admission labs To OR when ready   K. Therese Sarah, M.D. Attending Obstetrician & Gynecologist, [facility] for [employer], [facility]   ADDENDUM  Alerted by RN that patient was requesting to leave. I went to pre-op area and patient was dressed, she was crying and upset, states she was called in early and then had to wait and did not want to wait any longer. I asked what I could do to make this better and she requested to be able to leave, I advised her she was free to leave. I asked if I could  reschedule or make her an appointment in office and she declined. Escorted to front by staff.    Baldemar LenisK. Meryl Havier Deeb, MD, [facility] Attending Center for [facility](Faculty Practice)

## 2020-12-31 ENCOUNTER — Telehealth: Payer: Self-pay | Admitting: Obstetrics and Gynecology

## 2020-12-31 NOTE — Telephone Encounter (Signed)
Attempted to call patient regarding cancellation of surgery yesterday, unable to leave voicemail. Will send mychart message.  Baldemar Lenis, MD, [facility] Attending Center for [employer] [facility])

## 2021-01-05 ENCOUNTER — Encounter: Payer: Self-pay | Admitting: Family Medicine

## 2021-01-12 ENCOUNTER — Other Ambulatory Visit: Payer: Self-pay

## 2021-01-12 ENCOUNTER — Encounter: Payer: Self-pay | Admitting: Family Medicine

## 2021-01-12 ENCOUNTER — Telehealth (INDEPENDENT_AMBULATORY_CARE_PROVIDER_SITE_OTHER): Payer: Medicare Other | Admitting: Family Medicine

## 2021-01-12 ENCOUNTER — Ambulatory Visit (INDEPENDENT_AMBULATORY_CARE_PROVIDER_SITE_OTHER): Payer: Medicare Other | Admitting: Sports Medicine

## 2021-01-12 DIAGNOSIS — N200 Calculus of kidney: Secondary | ICD-10-CM

## 2021-01-12 DIAGNOSIS — R3911 Hesitancy of micturition: Secondary | ICD-10-CM | POA: Diagnosis not present

## 2021-01-12 DIAGNOSIS — E039 Hypothyroidism, unspecified: Secondary | ICD-10-CM | POA: Diagnosis not present

## 2021-01-12 DIAGNOSIS — M48061 Spinal stenosis, lumbar region without neurogenic claudication: Secondary | ICD-10-CM | POA: Diagnosis not present

## 2021-01-12 DIAGNOSIS — D582 Other hemoglobinopathies: Secondary | ICD-10-CM

## 2021-01-12 DIAGNOSIS — M5030 Other cervical disc degeneration, unspecified cervical region: Secondary | ICD-10-CM

## 2021-01-12 DIAGNOSIS — R790 Abnormal level of blood mineral: Secondary | ICD-10-CM | POA: Diagnosis not present

## 2021-01-12 MED ORDER — HEAT WRAPS MISC: 0 refills | Status: AC

## 2021-01-12 MED ORDER — CYCLOBENZAPRINE HCL 10 MG PO TABS: 10.0000 mg | ORAL_TABLET | Freq: Every day | ORAL | 11 refills | Status: AC

## 2021-01-12 MED ORDER — TRAMADOL HCL 50 MG PO TABS: 50.0000 mg | ORAL_TABLET | Freq: Two times a day (BID) | ORAL | 3 refills | Status: DC | PRN

## 2021-01-12 NOTE — Assessment & Plan Note (Signed)
Possible stone on left side based on plain film. Currently asymptomatic. We could do further imaging if needed but recommend hold off for now.  If pain or hematuria we can work up further.

## 2021-01-12 NOTE — Progress Notes (Signed)
Virtual Visit via Video Note  I connected with [name] on 01/12/21 at  1:00 PM EST by a video enabled telemedicine application and verified that I am speaking with the correct person using two identifiers.   I discussed the limitations of evaluation and management by telemedicine and the availability of in person appointments. The patient expressed understanding and agreed to proceed.  Patient location: athome.  Provider location: in office  Subjective:    CC: wants to go over recent labs and imaging results.    HPI: Xray showed possible Left kidney stones. She is not having any back or flank pain or hematuria.  No prior dx   She stated that it gets hard for her to completely empty her bladder and was advised that this may be the issue. She has to bend forward and put pressure to on the bladder to urinate. She reports that she has never had a history of kidney stones or UTIs. No leaking of urine.   She noticed that her RDW is elevated at 17.0 and she wanted to know about that. She has been taking the iron supplement as directed. Last ferritin was low 2 about 2 months ago.    Due to recheck her thyroid. Last TSH was 4.11. no recent changes to energy, etc.   She will likely be moving to [place] in March/April to move back to where her sisters live and where she is from.  She and her husband Nadine Counts want to be near family.   Past medical history, Surgical history, Family history not pertinant except as noted below, Social history, Allergies, and medications have been entered into the medical record, reviewed, and corrections made.   Review of Systems: No fevers, chills, night sweats, weight loss, chest pain, or shortness of breath.   Objective:    General: Speaking clearly in complete sentences without any shortness of breath.  Alert and oriented x3.  Normal judgment. No apparent acute distress.    Impression and Recommendations:    Hypothyroid Plant to recheck TSH.  She has moved  time of her med so we may need to adjust her dose so she can be more consistent in taking her med near other meds and food.  Had been waking up in middle of night to take her med.   Kidney stone Possible stone on left side based on plain film. Currently asymptomatic. We could do further imaging if needed but recommend hold off for now.  If pain or hematuria we can work up further.   Urinary hesitancy Discussed potential causes.  Rec Urology referral. She thinks she wants to wait for referral until she moves to [place].   Orders Placed This Encounter  Procedures  . Ferritin  . Iron  . B12 and Folate Panel  . TSH       Time spent in encounter 22 minutes  I discussed the assessment and treatment plan with the patient. The patient was provided an opportunity to ask questions and all were answered. The patient agreed with the plan and demonstrated an understanding of the instructions.   The patient was advised to call back or seek an in-person evaluation if the symptoms worsen or if the condition fails to improve as anticipated.   Nani Gasser, MD

## 2021-01-12 NOTE — Progress Notes (Signed)
Kidney stone found in L kidney that isn't moving.   She stated that it gets hard for her to completely empty her bladder and was advised that this may be the issue.   She reports that she has never had a history of kidney stones or UTIs.   She noticed that her RDW is elevated at 17.0 and she wanted to know about that. She has been taking the iron supplement as directed.

## 2021-01-12 NOTE — Assessment & Plan Note (Signed)
Discussed potential causes.  Rec Urology referral. She thinks she wants to wait for referral until she moves to [place].

## 2021-01-12 NOTE — Assessment & Plan Note (Signed)
This is a pleasant 70 year old female with known multilevel cervical DDD, initially responded well to conservative treatment, more recently a burst of prednisone was not helpful, at this point tramadol is effective, she uses approximately 2 total pills a day. We are going to refill this. No MRI needed, she is maybe moving to [place].

## 2021-01-12 NOTE — Progress Notes (Signed)
    Procedures performed today:    None.  Independent interpretation of notes and tests performed by another provider:   None.  Brief History, Exam, Impression, and Recommendations:    DDD (degenerative disc disease), cervical This is a pleasant 70 year old female with known multilevel cervical DDD, initially responded well to conservative treatment, more recently a burst of prednisone was not helpful, at this point tramadol is effective, she uses approximately 2 total pills a day. We are going to refill this. No MRI needed, she is maybe moving to [place].  Lumbar spinal stenosis This 70 year old female also has multilevel lumbar spinal stenosis from L3-S1, Soma ineffectively controlling pain, we will discontinue this and switch to high-dose Flexeril at night. Still not ready to consider epidural so we will hold off on any intervention.     ___________________________________________ [name]. Benjamin Stain, M.D., ABFM., CAQSM. Primary Care and Sports Medicine [facility]  Adjunct Instructor of Family Medicine  [employer]

## 2021-01-12 NOTE — Assessment & Plan Note (Signed)
Plant to recheck TSH.  She has moved time of her med so we may need to adjust her dose so she can be more consistent in taking her med near other meds and food.  Had been waking up in middle of night to take her med.

## 2021-01-12 NOTE — Assessment & Plan Note (Signed)
This 70 year old female also has multilevel lumbar spinal stenosis from L3-S1, Soma ineffectively controlling pain, we will discontinue this and switch to high-dose Flexeril at night. Still not ready to consider epidural so we will hold off on any intervention.

## 2021-01-18 ENCOUNTER — Other Ambulatory Visit: Payer: Self-pay | Admitting: Family Medicine

## 2021-01-19 DIAGNOSIS — R3911 Hesitancy of micturition: Secondary | ICD-10-CM | POA: Diagnosis not present

## 2021-01-19 DIAGNOSIS — E538 Deficiency of other specified B group vitamins: Secondary | ICD-10-CM | POA: Diagnosis not present

## 2021-01-19 DIAGNOSIS — E039 Hypothyroidism, unspecified: Secondary | ICD-10-CM | POA: Diagnosis not present

## 2021-01-19 DIAGNOSIS — D582 Other hemoglobinopathies: Secondary | ICD-10-CM | POA: Diagnosis not present

## 2021-01-19 DIAGNOSIS — R790 Abnormal level of blood mineral: Secondary | ICD-10-CM | POA: Diagnosis not present

## 2021-01-20 LAB — B12 AND FOLATE PANEL
Folate: 16.2 ng/mL
Vitamin B-12: 458 pg/mL (ref 200–1100)

## 2021-01-20 LAB — IRON: Iron: 119 ug/dL (ref 45–160)

## 2021-01-20 LAB — TSH: TSH: 2.16 mIU/L (ref 0.40–4.50)

## 2021-01-20 LAB — FERRITIN: Ferritin: 12 ng/mL — ABNORMAL LOW (ref 16–288)

## 2021-01-26 IMAGING — DX DG CERVICAL SPINE COMPLETE 4+V
6 series · 6 of 6 positions shown · non-contrast
Comparison: 05/20/2020

CLINICAL DATA: Chronic neck pain, cervical degenerative disc
disease, mother had metastatic breast cancer to bone

EXAM:
CERVICAL SPINE - COMPLETE 4+ VIEW

[c-spine lat]
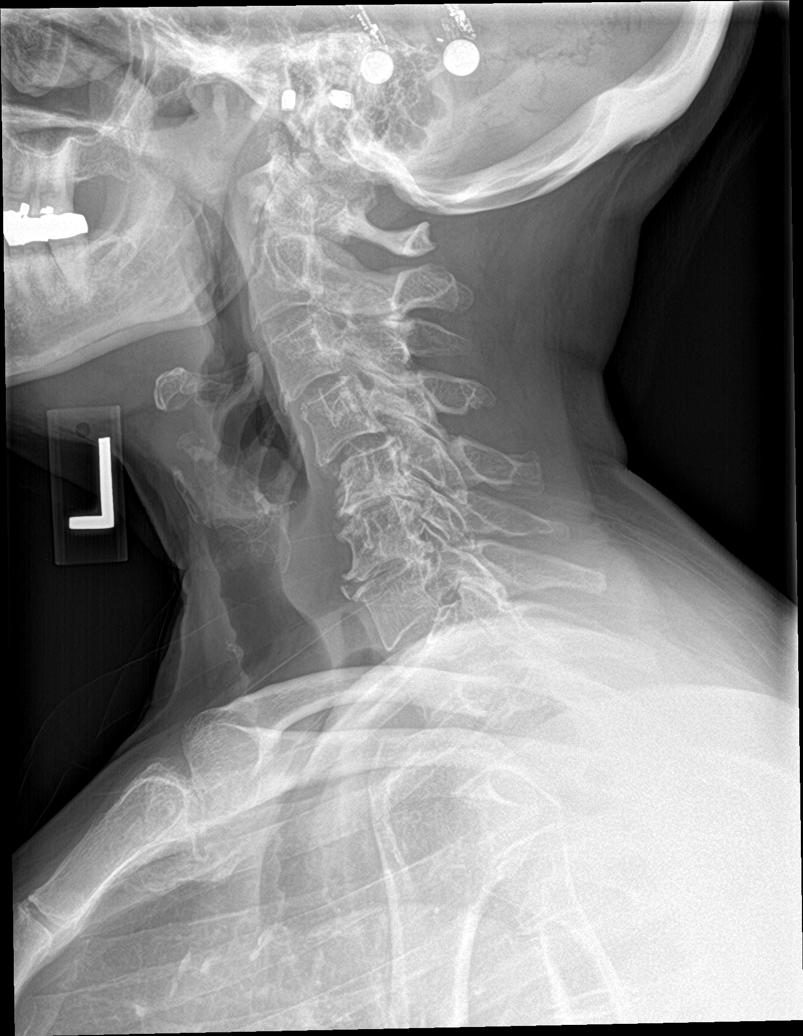

[c-spine obl (1 of 2)]
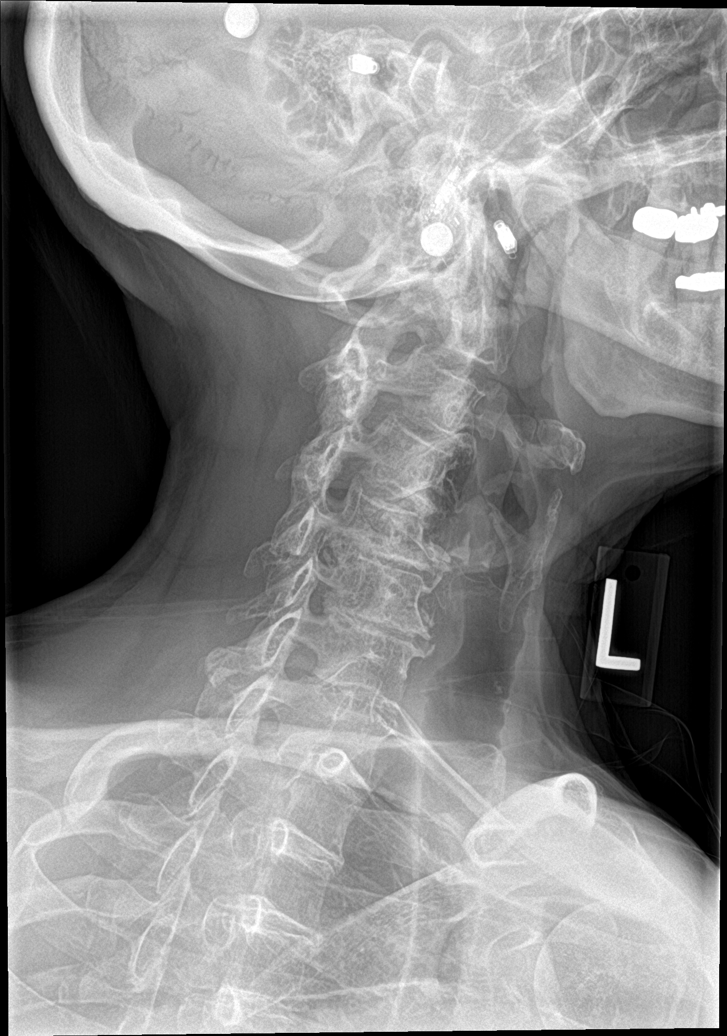

[c-spine obl (2 of 2)]
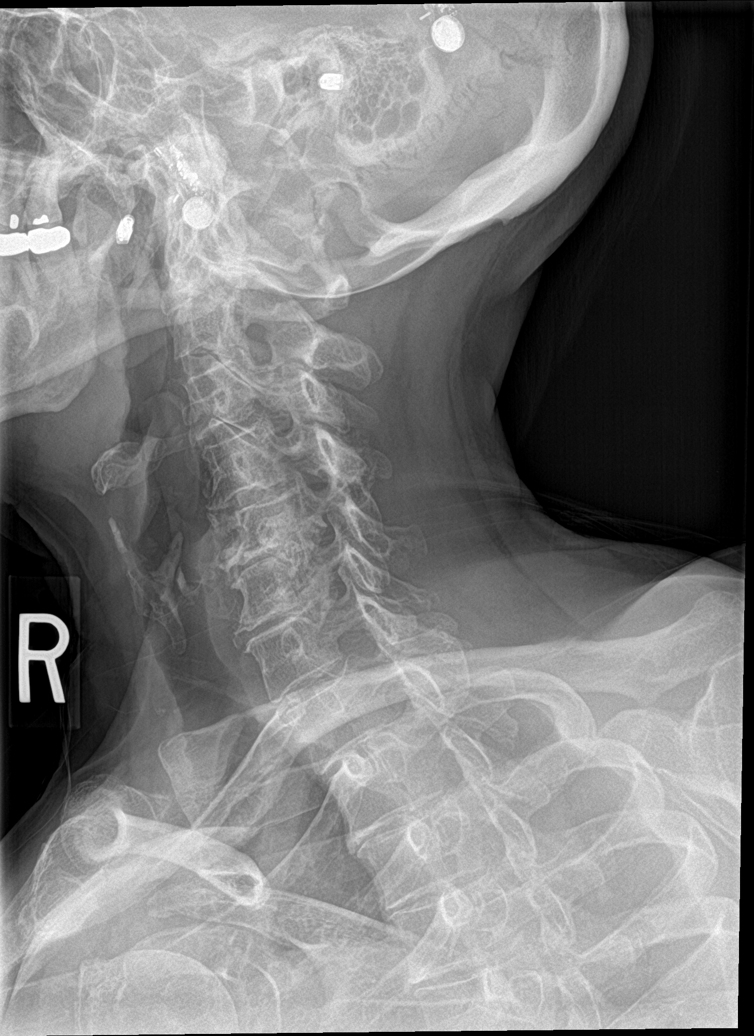

[c-spine ap]
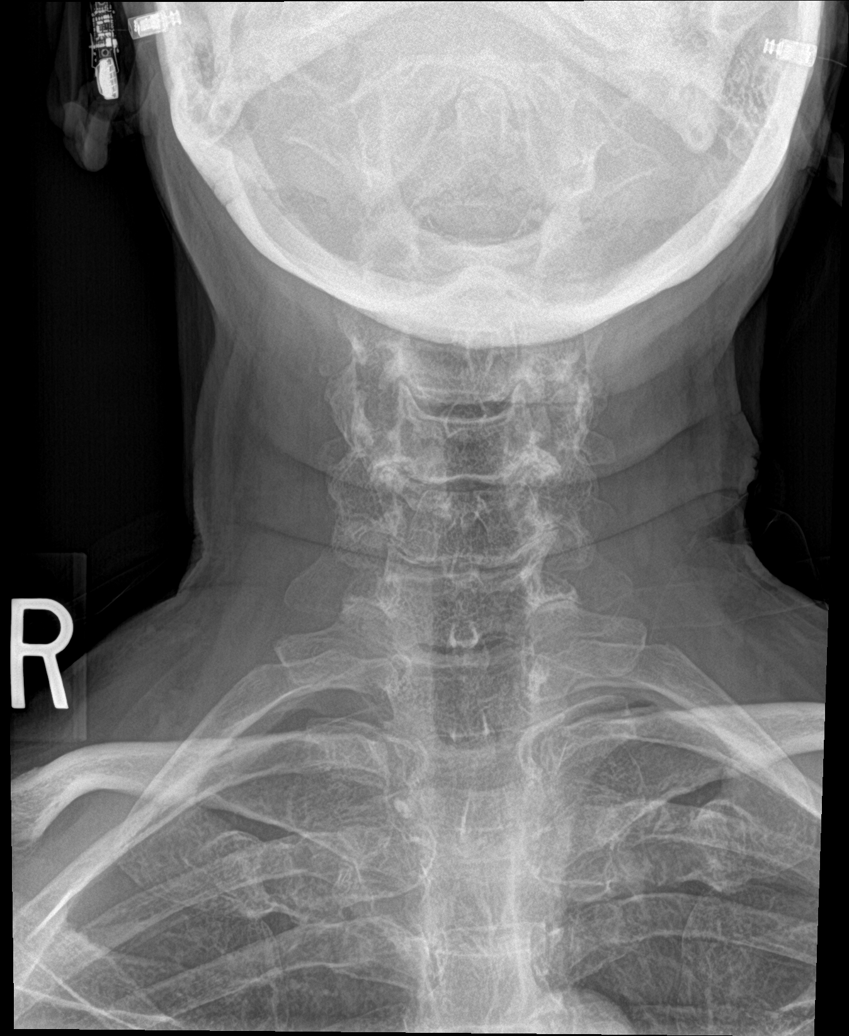

[c-spine open mouth]
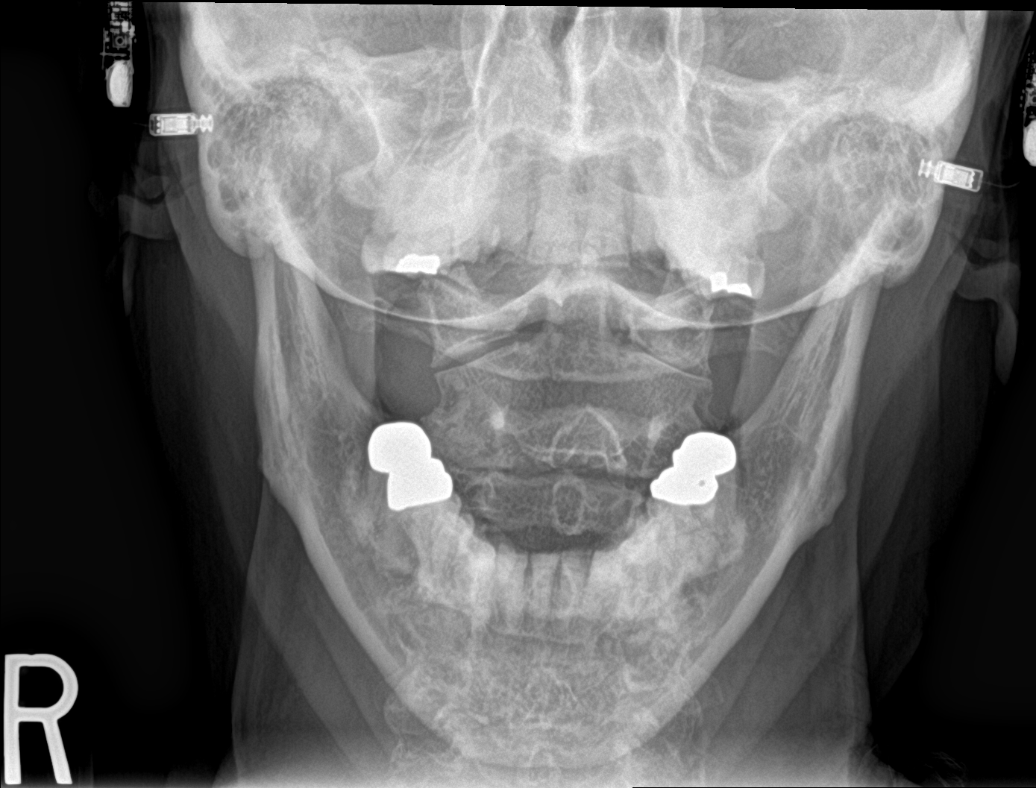

[c-spine swimmers]
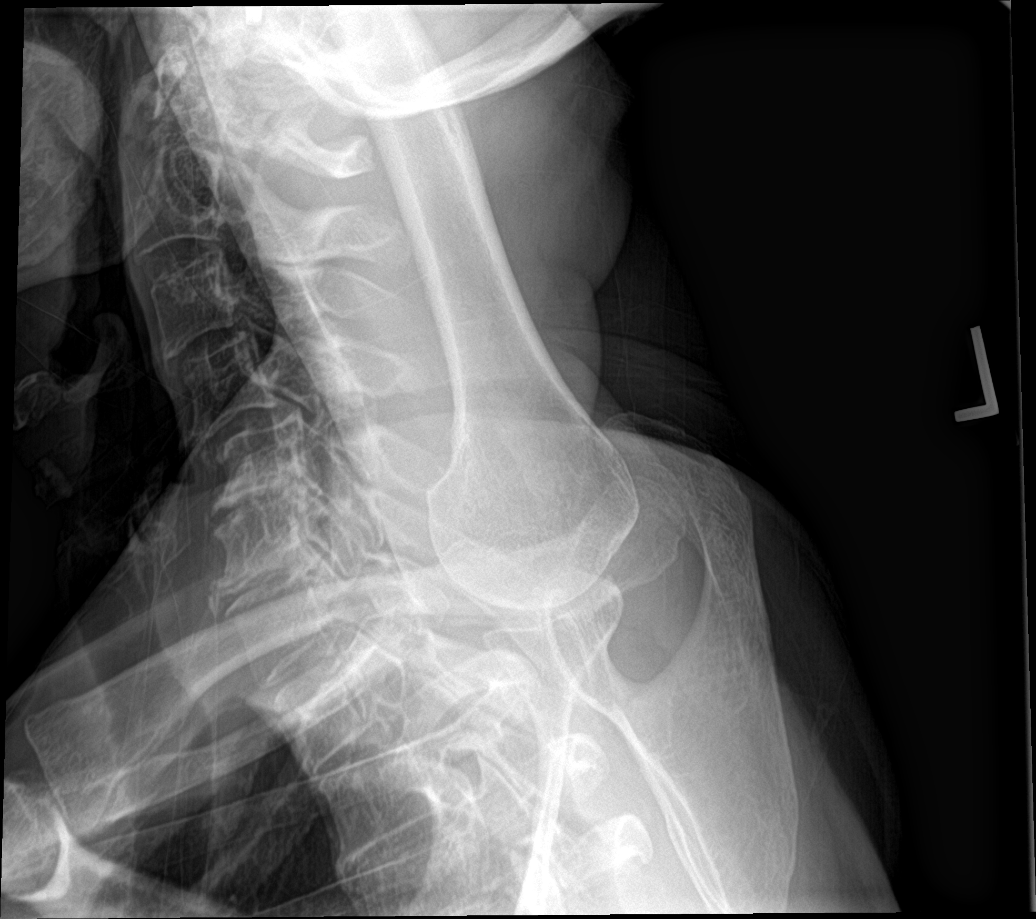

[6 of 6 positions shown; findings below may reference images not displayed]

FINDINGS: Osseous demineralization.

Minimal anterolisthesis at C4-C5.

Remaining alignments normal.

Reversal of cervical lordosis question muscle spasm.

Multilevel facet degenerative changes.

Disc space narrowing and endplate spur formation greatest at C5-C6
and C6-C7, less C4-C5.

No fracture, subluxation or bone destruction.

Extensive encroachment upon cervical neural foramina bilaterally at
C5-C6 and to lesser degrees at C6-C7 due to uncovertebral spurring.

Lung apices clear.
IMPRESSION: Multilevel degenerative disc and facet disease changes of the
cervical spine as above.

Neural foraminal stenoses at C5-C6 and C6-C7 due primarily to
uncovertebral spurring.

## 2021-02-27 ENCOUNTER — Other Ambulatory Visit: Payer: Self-pay | Admitting: Family Medicine

## 2021-02-27 ENCOUNTER — Other Ambulatory Visit: Payer: Self-pay | Admitting: Sports Medicine

## 2021-02-27 DIAGNOSIS — M48061 Spinal stenosis, lumbar region without neurogenic claudication: Secondary | ICD-10-CM

## 2021-04-07 DIAGNOSIS — Z23 Encounter for immunization: Secondary | ICD-10-CM | POA: Diagnosis not present

## 2021-04-10 ENCOUNTER — Other Ambulatory Visit: Payer: Self-pay | Admitting: Family Medicine

## 2021-04-14 ENCOUNTER — Other Ambulatory Visit: Payer: Self-pay | Admitting: Family Medicine

## 2021-04-15 ENCOUNTER — Other Ambulatory Visit: Payer: Self-pay | Admitting: Sports Medicine

## 2021-04-15 DIAGNOSIS — M5030 Other cervical disc degeneration, unspecified cervical region: Secondary | ICD-10-CM

## 2021-04-15 MED ORDER — LEVOTHYROXINE SODIUM 50 MCG PO TABS: 50.0000 ug | ORAL_TABLET | Freq: Every day | ORAL | 2 refills | Status: AC

## 2021-04-29 ENCOUNTER — Other Ambulatory Visit: Payer: Self-pay | Admitting: Family Medicine

## 2021-06-09 ENCOUNTER — Other Ambulatory Visit: Payer: Self-pay | Admitting: Family Medicine

## 2021-06-18 ENCOUNTER — Other Ambulatory Visit: Payer: Self-pay | Admitting: Family Medicine
